# Patient Record
Sex: Female | Born: 1980 | Race: Black or African American | Hispanic: No | Marital: Married
Health system: Midwestern US, Community
[De-identification: ages and names within clinical notes are randomized; demographics above are authoritative.]

---

## 2013-09-20 LAB — EKG, 12 LEAD, INITIAL
Atrial Rate: 76 {beats}/min
Calculated P Axis: 65 degrees
Calculated R Axis: 45 degrees
Calculated T Axis: 27 degrees
Diagnosis: NORMAL
P-R Interval: 170 ms
Q-T Interval: 382 ms
QRS Duration: 76 ms
QTC Calculation (Bezet): 429 ms
Ventricular Rate: 76 {beats}/min

## 2013-09-20 MED ADMIN — sodium chloride 0.9 % bolus infusion 1,000 mL: INTRAVENOUS | @ 12:00:00 | NDC 00409798309

## 2013-09-20 MED ADMIN — meclizine (ANTIVERT) tablet 50 mg: ORAL | @ 13:00:00 | NDC 00536398501

## 2013-09-20 NOTE — ED Notes (Signed)
PT. Medicated as ordered , resting in bed at this time stable will continue to monitor.

## 2013-09-20 NOTE — ED Notes (Signed)
"  I threw up twice too."

## 2013-09-20 NOTE — ED Notes (Signed)
"  I woke up around 3 this morning really dizzy."

## 2013-09-20 NOTE — ED Provider Notes (Signed)
HPI Comments: Nicole Watson is a 32 y.o. Female who was brought to he ED by her spouse for evaluation. Spouse is present. Patient c/o dizziness since waking up at 0300 AM. She describes the dizziness as the "room-spinning" and states that she was barely able to stand secondary to the dizziness. The dizziness is associated with nausea/vomiting  x2 that began at 0630 AM, generalized weakness, blurry vision, and states that the dizziness worsens with movement. Patient denies ever having sx's before. She states that she ran 2 miles yesterday and thought that she was just tired from that. She has no medical problems. Denies tinnitus, headache, and any new medication. No other complaints expressed at this time.     The history is provided by the patient.        History reviewed. No pertinent past medical history.     Past Surgical History   Procedure Laterality Date   ??? Hx cesarean section     ??? Hx other surgical       tummy tuck         History reviewed. No pertinent family history.     History     Social History   ??? Marital Status: MARRIED     Spouse Name: N/A     Number of Children: N/A   ??? Years of Education: N/A     Occupational History   ??? Not on file.     Social History Main Topics   ??? Smoking status: Never Smoker    ??? Smokeless tobacco: Not on file   ??? Alcohol Use: No   ??? Drug Use: Not on file   ??? Sexually Active: Not on file     Other Topics Concern   ??? Not on file     Social History Narrative   ??? No narrative on file                  ALLERGIES: Pcn      Review of Systems   Constitutional: Negative for fever and chills.   HENT: Negative for congestion, sore throat and rhinorrhea.    Eyes: Negative.    Respiratory: Negative for cough and shortness of breath.    Cardiovascular: Negative for chest pain.   Gastrointestinal: Positive for nausea and vomiting. Negative for abdominal pain and diarrhea.   Endocrine: Negative.    Genitourinary: Negative.    Musculoskeletal: Negative for back pain.   Skin: Negative for  rash.   Allergic/Immunologic: Negative.    Neurological: Positive for dizziness (room spinning) and weakness (generalized). Negative for syncope, light-headedness and headaches.   Hematological: Negative.    Psychiatric/Behavioral: Negative.    All other systems reviewed and are negative.        Filed Vitals:    09/20/13 0804 09/20/13 0815 09/20/13 0830 09/20/13 0845   BP: 140/96 152/88  128/87   Pulse: 72 71 65 74   Temp: 98.5 ??F (36.9 ??C)      Resp: 16 15 16 19    Height: 5\' 3"  (1.6 m)      Weight: 62.143 kg (137 lb)      SpO2: 100% 100% 100% 100%            Physical Exam   Nursing note and vitals reviewed.  Constitutional: She is oriented to person, place, and time. She appears well-developed and well-nourished. No distress.   HENT:   Head: Normocephalic and atraumatic.   Mouth/Throat: Oropharynx is clear and moist.   Eyes:  Conjunctivae are normal. Pupils are equal, round, and reactive to light. No scleral icterus.   Neck: Normal range of motion. Neck supple. No tracheal deviation present.   Cardiovascular: Normal rate, normal heart sounds and intact distal pulses.    Pulmonary/Chest: Effort normal and breath sounds normal. No respiratory distress. She has no wheezes.   Abdominal: Soft. Bowel sounds are normal. She exhibits no distension. There is no tenderness.   Musculoskeletal: Normal range of motion. She exhibits no edema.   Lymphadenopathy:     She has no cervical adenopathy.   Neurological: She is alert and oriented to person, place, and time. She has normal strength. No cranial nerve deficit.   She has worsening dizziness with movement. No cerebellar ataxia.    Skin: Skin is warm and dry. She is not diaphoretic.   Psychiatric: She has a normal mood and affect.        MDM     Differential Diagnosis; Clinical Impression; Plan:     Vertigo vs viral illness     AFVSS  gave IVF meclizine    Feeling better. Dc home with fu. Rx meclizine        Amount and/or Complexity of Data Reviewed:   Tests in the radiology  section of CPT??:  Ordered and reviewed  Discussion of test results with the performing providers:  No   Decide to obtain previous medical records or to obtain history from someone other than the patient:  No   Obtain history from someone other than the patient:  No   Review and summarize past medical records:  Yes   Discuss the patient with another provider:  No   Independant visualization of image, tracing, or specimen:  Yes      Procedures    PROGRESS NOTES  8:16 AM: Ewing Schlein, DO arrives to the bedside to evaluate the patient. Spouse is present. Answered the patient's questions regarding the treatment plan.  9:32 AM: The patient feels better. Reviewed EKG results with the patient. Discharge instructions and plan for follow-up discussed with the patient. The patient is ready to go home.       CONSULTATIONS  None      ED PHYSICIAN ORDERS  Orders Placed This Encounter   ??? HCG URINE, QL     Standing Status: Standing      Number of Occurrences: 1      Standing Expiration Date:    ??? URINALYSIS W/ RFLX MICROSCOPIC     Standing Status: Standing      Number of Occurrences: 1      Standing Expiration Date:    ??? EKG, 12 LEAD, INITIAL     Standing Status: Standing      Number of Occurrences: 1      Standing Expiration Date:      Order Specific Question:  Reason for Exam:     Answer:  dizziness   ??? multivitamin (ONE A DAY) tablet     Sig: Take 1 tablet by mouth daily.   ??? ibuprofen (MOTRIN) 800 mg tablet     Sig: Take 800 mg by mouth every six (6) hours as needed for Pain.   ??? sodium chloride 0.9 % bolus infusion 1,000 mL     Sig:    ??? meclizine (ANTIVERT) tablet 50 mg     Sig:            MEDICATIONS ORDERED  Medications   sodium chloride 0.9 % bolus infusion 1,000 mL (1,000 mL  IntraVENous New Bag 09/20/13 0829)   meclizine (ANTIVERT) tablet 50 mg (50 mg Oral Given 09/20/13 0830)           RADIOLOGY INTERPRETATIONS  None        EKG READINGS/LABORATORY RESULTS  Recent Results (from the past 12 hour(s))   EKG, 12 LEAD,  INITIAL    Collection Time     09/20/13  8:18 AM       Result Value Range    Ventricular Rate 76      Atrial Rate 76      P-R Interval 170      QRS Duration 76      Q-T Interval 382      QTC Calculation (Bezet) 429      Calculated P Axis 65      Calculated R Axis 45      Calculated T Axis 27      Diagnosis        Value: Normal sinus rhythm      Left atrial enlargement      Nonspecific ST and T wave abnormality      Confirmed by Martie Round ([id number]) on 09/20/2013 8:48:38 AM     8:25 AM: Rhythm: normal sinus rhythm. Rate (approx.):76; Axis: normal;  QRS interval: normal ; ST/T wave: No ST elevation or T-wave conversion. This EKG was interpreted by Ewing Schlein, DO,ED Provider.        ED DIAGNOSIS & DISPOSITION INFORMATION  Diagnosis: No diagnosis found.      Disposition: Discharge    Follow-up Information    None          Patient's Medications   Start Taking    No medications on file   Continue Taking    IBUPROFEN (MOTRIN) 800 MG TABLET    Take 800 mg by mouth every six (6) hours as needed for Pain.    MULTIVITAMIN (ONE A DAY) TABLET    Take 1 tablet by mouth daily.   These Medications have changed    No medications on file   Stop Taking    No medications on file         SCRIBE ATTESTATION STATEMENT  Documented by: Zenita N. Wharf, scribing for and in the presence of Ewing Schlein, DO. (8:16 AM)    PROVIDER ATTESTATION STATEMENT  I personally performed the services described in the documentation, reviewed the documentation, as recorded by the scribe in my presence, and it accurately and completely records my words and actions.  Ewing Schlein, DO. 9:32 AM

## 2013-09-20 NOTE — ED Notes (Signed)
PT. Resting in bed at this time asleep, stable will continue to monitor.

## 2013-09-20 NOTE — ED Notes (Signed)
Assumed pt. Care - pt. C/o dizziness since 3 am this morning - pt. Stated she has also vomited x2 .     PT. Presents alert and oriented x4 c/o dizziness at this time, no active vomiting noted.    PT. Placed on cardiac monitor, IV established blood drawn and sent to lab, EKG completed and shown to MD.     PT. Resting in bed at this time on cardiac monitor, VSS, pt. Stable, family at bedside will continue to monitor.

## 2013-09-20 NOTE — ED Notes (Signed)
PT. Stated her "tubes are tied", denies being pregnant.

## 2013-09-20 NOTE — ED Notes (Signed)
Discharge instructions reviewed with pt- pt verbalized understanding - pt stable ambulatory with family upon discharge.

## 2016-07-07 ENCOUNTER — Inpatient Hospital Stay
Admit: 2016-07-07 | Discharge: 2016-07-07 | Disposition: A | Discharge status: Home / Self Care | Payer: TRICARE (CHAMPUS) | Attending: Emergency Medicine

## 2016-07-07 DIAGNOSIS — R51 Headache: Secondary | ICD-10-CM

## 2016-07-07 LAB — CBC WITH AUTOMATED DIFF
ABS. BASOPHILS: 0 10*3/uL (ref 0.0–0.06)
ABS. EOSINOPHILS: 0.2 10*3/uL (ref 0.0–0.4)
ABS. LYMPHOCYTES: 1.3 10*3/uL (ref 0.9–3.6)
ABS. MONOCYTES: 0.8 10*3/uL (ref 0.05–1.2)
ABS. NEUTROPHILS: 4 10*3/uL (ref 1.8–8.0)
BASOPHILS: 0 % (ref 0–2)
EOSINOPHILS: 3 % (ref 0–5)
HCT: 37.3 % (ref 35.0–45.0)
HGB: 12.1 g/dL (ref 12.0–16.0)
LYMPHOCYTES: 21 % (ref 21–52)
MCH: 26.8 PG (ref 24.0–34.0)
MCHC: 32.4 g/dL (ref 31.0–37.0)
MCV: 82.7 FL (ref 74.0–97.0)
MONOCYTES: 12 % — ABNORMAL HIGH (ref 3–10)
MPV: 10.5 FL (ref 9.2–11.8)
NEUTROPHILS: 64 % (ref 40–73)
PLATELET: 265 10*3/uL (ref 135–420)
RBC: 4.51 M/uL (ref 4.20–5.30)
RDW: 13.4 % (ref 11.6–14.5)
WBC: 6.2 10*3/uL (ref 4.6–13.2)

## 2016-07-07 LAB — METABOLIC PANEL, BASIC
Anion gap: 9 mmol/L (ref 3.0–18)
BUN/Creatinine ratio: 15 (ref 12–20)
BUN: 15 MG/DL (ref 7.0–18)
CO2: 27 mmol/L (ref 21–32)
Calcium: 9.4 MG/DL (ref 8.5–10.1)
Chloride: 104 mmol/L (ref 100–108)
Creatinine: 0.98 MG/DL (ref 0.6–1.3)
GFR est AA: >60 mL/min/{1.73_m2} (ref >60)
GFR est non-AA: >60 mL/min/{1.73_m2} (ref >60)
Glucose: 79 mg/dL (ref 74–99)
Potassium: 3.8 mmol/L (ref 3.5–5.5)
Sodium: 140 mmol/L (ref 136–145)

## 2016-07-07 MED ORDER — LISINOPRIL 10 MG TAB
10 mg | ORAL_TABLET | Freq: Every day | ORAL | 0 refills | Status: AC
Start: 2016-07-07 — End: 2016-08-06

## 2016-07-07 MED ORDER — IBUPROFEN 400 MG TAB
400 mg | ORAL | Status: AC
Start: 2016-07-07 — End: 2016-07-07
  Administered 2016-07-07: 20:00:00 via ORAL

## 2016-07-07 MED ORDER — IBUPROFEN 800 MG TAB
800 mg | ORAL_TABLET | Freq: Three times a day (TID) | ORAL | 0 refills | Status: AC
Start: 2016-07-07 — End: 2016-07-12

## 2016-07-07 MED ORDER — CLONIDINE 0.1 MG TAB
0.1 mg | ORAL | Status: AC
Start: 2016-07-07 — End: 2016-07-07
  Administered 2016-07-07: 20:00:00 via ORAL

## 2016-07-07 MED FILL — CLONIDINE 0.1 MG TAB: 0.1 mg | ORAL | Qty: 2

## 2016-07-07 MED FILL — IBUPROFEN 400 MG TAB: 400 mg | ORAL | Qty: 2

## 2016-07-07 NOTE — ED Notes (Signed)
I have reviewed discharge instructions with the patient.  The patient verbalized understanding.

## 2016-07-07 NOTE — ED Provider Notes (Signed)
[place]  [facility]      35 y.o. female with noted past medical history who presents to the emergency department complaining of a "sharp" headache starting approximately 2 hours ago while at work.  She denies taking medication for the ailment.  Denies visual disturbances.     No other complaints.     No current facility-administered medications for this encounter.      Current Outpatient Prescriptions   Medication Sig   ??? multivitamin (ONE A DAY) tablet Take 1 tablet by mouth daily.       No past medical history on file.    Past Surgical History:   Procedure Laterality Date   ??? HX CESAREAN SECTION     ??? HX OTHER SURGICAL      tummy tuck       No family history on file.    Social History     Social History   ??? Marital status: MARRIED     Spouse name: N/A   ??? Number of children: N/A   ??? Years of education: N/A     Occupational History   ??? Not on file.     Social History Main Topics   ??? Smoking status: Never Smoker   ??? Smokeless tobacco: Never Used   ??? Alcohol use No   ??? Drug use: Not on file   ??? Sexual activity: Not on file     Other Topics Concern   ??? Not on file     Social History Narrative       Allergies   Allergen Reactions   ??? Pcn [Penicillins] Rash       Patient's primary care provider (as noted in EPIC):  Phys Other, MD    REVIEW OF SYSTEMS:    Constitutional:  Negative for diaphoresis.  HENT:  Negative for congestion.    Respiratory:  Negative for cough and shortness of breath.    Cardiovascular:  Negative for chest pain and palpitations.   Gastrointestinal:  Negative for diarrhea.  Genitourinary:  Negative for flank pain.   Musculoskeletal:  Negative for back pain.   Skin:  Negative for pallor.   Neurological:  Negative for focal numbness, weakness or tingling.  Negative for slurred speech.    Visit Vitals   ??? BP (!) 173/120   ??? Pulse 80   ??? Temp 98.5 ??F (36.9 ??C)   ??? Resp 16   ??? Wt 67.6 kg (149 lb)   ??? SpO2 100%   ??? BMI 26.39 kg/m2       PHYSICAL EXAM:     CONSTITUTIONAL:  Alert, in no apparent distress;  well developed;  well nourished.  HEAD:  Normocephalic, atraumatic.  EYES:  EOMI.  Non-icteric sclera.  Normal conjunctiva.  ENTM:  Nose:  no rhinorrhea.  Throat:  no erythema or exudate, mucous membranes moist.  NECK:  No JVD.  Supple  RESPIRATORY:  Chest clear, equal breath sounds, good air movement.  CARDIOVASCULAR:  Regular rate and rhythm.  No murmurs, rubs, or gallops.  GI:  Normal bowel sounds, abdomen soft and non-tender.  No rebound or guarding.  BACK:  Non-tender.  UPPER EXT:  Normal inspection.  LOWER EXT:  No edema, no calf tenderness.  Distal pulses intact.  NEURO:  Moves all four extremities.  Normal motor exam and sensation in all four extremities.  Normal CN II-XII exam.  Normal bilateral finger-to-nose exam.      SKIN:  No rashes;  Normal for age.  PSYCH:  Alert and normal affect.    DIFFERENTIAL DIAGNOSES/ MEDICAL DECISION MAKING:  Tension headache, cluster headache, migraine headache, hypertension induced headache, temporal arteritis, intracranial hemorrhage, meningitis, electrolyte and/or endocrine imbalance, other etiologies, versus combination of the above.    No focal neuro deficits to suggest intracranial hemorrhage. No fever, neck stiffness, nor meningeal signs to suggest meningitis.    Abnormal lab results from this emergency department encounter:  Labs Reviewed   CBC WITH AUTOMATED DIFF - Abnormal; Notable for the following:        Result Value    MONOCYTES 12 (*)     All other components within normal limits   METABOLIC PANEL, BASIC       Lab values for this patient within approximately the last 12 hours:  Recent Results (from the past 12 hour(s))   CBC WITH AUTOMATED DIFF    Collection Time: 07/07/16  3:50 PM   Result Value Ref Range    WBC 6.2 4.6 - 13.2 K/uL    RBC 4.51 4.20 - 5.30 M/uL    HGB 12.1 12.0 - 16.0 g/dL    HCT 09.8 11.9 - 14.7 %    MCV 82.7 74.0 - 97.0 FL    MCH 26.8 24.0 - 34.0 PG    MCHC 32.4 31.0 - 37.0 g/dL     RDW 82.9 56.2 - 13.0 %    PLATELET 265 135 - 420 K/uL    MPV 10.5 9.2 - 11.8 FL    NEUTROPHILS 64 40 - 73 %    LYMPHOCYTES 21 21 - 52 %    MONOCYTES 12 (H) 3 - 10 %    EOSINOPHILS 3 0 - 5 %    BASOPHILS 0 0 - 2 %    ABS. NEUTROPHILS 4.0 1.8 - 8.0 K/UL    ABS. LYMPHOCYTES 1.3 0.9 - 3.6 K/UL    ABS. MONOCYTES 0.8 0.05 - 1.2 K/UL    ABS. EOSINOPHILS 0.2 0.0 - 0.4 K/UL    ABS. BASOPHILS 0.0 0.0 - 0.06 K/UL    DF AUTOMATED     METABOLIC PANEL, BASIC    Collection Time: 07/07/16  3:50 PM   Result Value Ref Range    Sodium 140 136 - 145 mmol/L    Potassium 3.8 3.5 - 5.5 mmol/L    Chloride 104 100 - 108 mmol/L    CO2 27 21 - 32 mmol/L    Anion gap 9 3.0 - 18 mmol/L    Glucose 79 74 - 99 mg/dL    BUN 15 7.0 - 18 MG/DL    Creatinine 8.65 0.6 - 1.3 MG/DL    BUN/Creatinine ratio 15 12 - 20      GFR est AA >60 >60 ml/min/1.4m2    GFR est non-AA >60 >60 ml/min/1.48m2    Calcium 9.4 8.5 - 10.1 MG/DL       Radiologist and cardiologist interpretations if available at time of this note:  No results found.    Medication(s) ordered for patient during this emergency visit encounter:  Medications   cloNIDine HCl (CATAPRES) tablet 0.2 mg (0.2 mg Oral Given 07/07/16 1535)   ibuprofen (MOTRIN) tablet 800 mg (800 mg Oral Given 07/07/16 1535)       ED COURSE:    The patient's headache was markedly improved with the noted non-narcotic medications.      IMPRESSION AND MEDICAL DECISION MAKING:  Based upon the patient's presentation with noted HPI and PE, along with the work up done in  the emergency department, I believe that the patient is having a headache.      The patient does not appear toxic and is neurologically normal on exam.  No evidence for SAH, meningitis, tumor, or other malignant cause today based on evaluation today.  I do not believe that this is a CVA or TIA presentation.  Given the history and physical exam, I believe symptomatic treatment is appropriate at this time.  The patient understands the need  for follow-up and the symptoms and signs that would mandate immediate return to the ED for re-evaluation.    DIAGNOSIS:  1.  Headache.   2.  Elevated blood pressure without history of hypertension.     SPECIFIC PATIENT INSTRUCTIONS FROM THE PHYSICIAN WHO TREATED YOU IN THE ER TODAY:  1.  Return if any concerns or worsening of condition(s)  2.  Ibuprofen as prescribed only if you have a headache.   3.  FOLLOW UP APPOINTMENT:  Your primary doctor in the next week.   4.  Have you blood pressure recheck by your doctor this week. It was elevated during your Emergency Department visit today. In the days before you see your doctor, recheck your blood pressure at home 2 times a day at the same times.  For example, you may decide to record your blood pressure at 8 am and 9 pm every day. Or 7 am and 7 pm every day.  Then take this list of recorded blood pressure readings to your doctor when you follow up with them. This will help guide them about what needs to be done with your blood pressure, if anything.   5.  Lisinopril as prescribed.     Arlys John L. Westley Foots, M.D.    Provider Attestation:  If a scribe was utilized in generation of this patient record, I personally performed the services described in the documentation, reviewed the documentation, as recorded by the scribe in my presence, and it accurately records the patient's history of presenting illness, review of systems, patient physical examination, and procedures performed by me as the attending physician.     Arlys John L. Westley Foots, M.D.  [facility] Board Certified Emergency Physician  07/07/2016.  3:14 PM    Scribe Attestation  Echo Lindenhurst acting as a scribe for and in the presence of Suzanna Obey, MD  July 07, 2016 at 3:57 PM       Provider Attestation:  I personally performed the services described in the documentation, reviewed the documentation, as recorded by the scribe in my presence, and it accurately and completely records my words and actions.   Suzanna Obey, MD      Signed by: Trudee Grip, Scribe, July 07, 2016 at 3:57 PM

## 2016-07-07 NOTE — ED Triage Notes (Signed)
1 hour ago left temporal headache started. Unable to look at computer screen due to headache. [employer]

## 2017-02-24 ENCOUNTER — Inpatient Hospital Stay
Admit: 2017-02-24 | Discharge: 2017-02-24 | Disposition: A | Discharge status: Home / Self Care | Payer: TRICARE (CHAMPUS) | Attending: Emergency Medicine

## 2017-02-24 ENCOUNTER — Emergency Department: Admit: 2017-02-24 | Payer: TRICARE (CHAMPUS) | Primary: Family Medicine

## 2017-02-24 DIAGNOSIS — R51 Headache: Secondary | ICD-10-CM

## 2017-02-24 LAB — CBC WITH AUTOMATED DIFF
ABS. BASOPHILS: 0 10*3/uL (ref 0.0–0.06)
ABS. EOSINOPHILS: 0.2 10*3/uL (ref 0.0–0.4)
ABS. LYMPHOCYTES: 1 10*3/uL (ref 0.9–3.6)
ABS. MONOCYTES: 0.5 10*3/uL (ref 0.05–1.2)
ABS. NEUTROPHILS: 3.4 10*3/uL (ref 1.8–8.0)
BASOPHILS: 0 % (ref 0–2)
EOSINOPHILS: 3 % (ref 0–5)
HCT: 36.4 % (ref 35.0–45.0)
HGB: 11.8 g/dL — ABNORMAL LOW (ref 12.0–16.0)
LYMPHOCYTES: 20 % — ABNORMAL LOW (ref 21–52)
MCH: 26.9 PG (ref 24.0–34.0)
MCHC: 32.4 g/dL (ref 31.0–37.0)
MCV: 83.1 FL (ref 74.0–97.0)
MONOCYTES: 9 % (ref 3–10)
MPV: 10.2 FL (ref 9.2–11.8)
NEUTROPHILS: 68 % (ref 40–73)
PLATELET: 242 10*3/uL (ref 135–420)
RBC: 4.38 M/uL (ref 4.20–5.30)
RDW: 13.8 % (ref 11.6–14.5)
WBC: 5 10*3/uL (ref 4.6–13.2)

## 2017-02-24 LAB — METABOLIC PANEL, BASIC
Anion gap: 8 mmol/L (ref 3.0–18)
BUN/Creatinine ratio: 14 (ref 12–20)
BUN: 13 MG/DL (ref 7.0–18)
CO2: 26 mmol/L (ref 21–32)
Calcium: 9.1 MG/DL (ref 8.5–10.1)
Chloride: 107 mmol/L (ref 100–108)
Creatinine: 0.91 MG/DL (ref 0.6–1.3)
GFR est AA: >60 mL/min/{1.73_m2} (ref >60)
GFR est non-AA: >60 mL/min/{1.73_m2} (ref >60)
Glucose: 80 mg/dL (ref 74–99)
Potassium: 3.8 mmol/L (ref 3.5–5.5)
Sodium: 141 mmol/L (ref 136–145)

## 2017-02-24 MED ORDER — CLONIDINE 0.1 MG TAB
0.1 mg | ORAL | Status: AC
Start: 2017-02-24 — End: 2017-02-24
  Administered 2017-02-24: 21:00:00 via ORAL

## 2017-02-24 MED ORDER — METOCLOPRAMIDE 5 MG/ML IJ SOLN
5 mg/mL | INTRAMUSCULAR | Status: AC
Start: 2017-02-24 — End: 2017-02-24
  Administered 2017-02-24: 21:00:00 via INTRAVENOUS

## 2017-02-24 MED ORDER — DIPHENHYDRAMINE HCL 50 MG/ML IJ SOLN
50 mg/mL | INTRAMUSCULAR | Status: AC
Start: 2017-02-24 — End: 2017-02-24
  Administered 2017-02-24: 21:00:00 via INTRAVENOUS

## 2017-02-24 MED FILL — DIPHENHYDRAMINE HCL 50 MG/ML IJ SOLN: 50 mg/mL | INTRAMUSCULAR | Qty: 1

## 2017-02-24 MED FILL — CLONIDINE 0.1 MG TAB: 0.1 mg | ORAL | Qty: 1

## 2017-02-24 MED FILL — METOCLOPRAMIDE 5 MG/ML IJ SOLN: 5 mg/mL | INTRAMUSCULAR | Qty: 2

## 2017-02-24 NOTE — ED Triage Notes (Signed)
Patient states she has "sharp" pain to the L side of her head. States the pain started several months ago, no vision changes. deneis nausea

## 2017-02-24 NOTE — ED Notes (Signed)
I have reviewed discharge instructions with the patient.  The patient verbalized understanding.

## 2017-02-24 NOTE — ED Provider Notes (Signed)
EMERGENCY DEPARTMENT HISTORY AND PHYSICAL EXAM    3:44 PM      Date: 02/24/2017  Patient Name: Nicole IsaacsLatonya D Russaw    History of Presenting Illness     Chief Complaint   Patient presents with   ??? Head Pain         History Provided By: Patient    Chief Complaint: Head pain  Duration:  Months  Timing:  Intermittent  Location: Left side of head  Quality: Sharp  Severity: 3/10  Modifying Factors: Some relief with HTN medication and 800MG  ibuprofen  Associated Symptoms: Denies any      Additional History (Context): 3:52 PM Nicole IsaacsLatonya D Grosz is a 36 y.o. female with h/o HTN who presents to ED complaining of sharp intermittent left sided head pain that lasts for seconds at a time that is a 3/10 in severity now onset months but comes and goes. Patient states that the pain only last for seconds and is intermittent but today she experienced more episodes of pain. States it feels like an "ice pick". Earlier her pain was sharp and severe. Denies visual disturbances. No other concerns or symptoms at this time.    PCP: Phys Other, MD    Current Outpatient Prescriptions   Medication Sig Dispense Refill   ??? lisinopril (PRINIVIL, ZESTRIL) 20 mg tablet Take 20 mg by mouth daily.     ??? ergocalciferol (VITAMIN D2) 50,000 unit capsule Take 50,000 Units by mouth.     ??? multivitamin (ONE A DAY) tablet Take 1 tablet by mouth daily.         Past History     Past Medical History:  Past Medical History:   Diagnosis Date   ??? Hypertension        Past Surgical History:  Past Surgical History:   Procedure Laterality Date   ??? HX CESAREAN SECTION     ??? HX OTHER SURGICAL      tummy tuck       Family History:  History reviewed. No pertinent family history.    Social History:  Social History   Substance Use Topics   ??? Smoking status: Never Smoker   ??? Smokeless tobacco: Never Used   ??? Alcohol use No       Allergies:  Allergies   Allergen Reactions   ??? Pcn [Penicillins] Rash         Review of Systems     Review of Systems    Constitutional: Negative for diaphoresis and fever.   HENT: Negative for congestion and sore throat.    Eyes: Negative for pain, itching and visual disturbance.   Respiratory: Negative for cough and shortness of breath.    Cardiovascular: Negative for chest pain and palpitations.   Gastrointestinal: Negative for abdominal pain and diarrhea.   Endocrine: Negative for polydipsia and polyuria.   Genitourinary: Negative for dysuria and hematuria.   Musculoskeletal: Negative for arthralgias and myalgias.   Skin: Negative for rash and wound.   Neurological: Positive for headaches. Negative for seizures and syncope.   Hematological: Does not bruise/bleed easily.   Psychiatric/Behavioral: Negative for agitation and hallucinations.         Physical Exam     Visit Vitals   ??? BP 123/74   ??? Pulse 75   ??? Temp 98.1 ??F (36.7 ??C)   ??? Resp 16   ??? Ht 5\' 3"  (1.6 m)   ??? Wt 66.7 kg (147 lb)   ??? LMP 02/15/2017   ??? SpO2  99%   ??? BMI 26.04 kg/m2       Physical Exam   Constitutional: She is oriented to person, place, and time. She appears well-developed and well-nourished.   HENT:   Head: Normocephalic and atraumatic.   No tenderness of the head.  No TMJ tenderness   Eyes: Conjunctivae are normal. No scleral icterus.   Neck: Normal range of motion. Neck supple. No JVD present.   Cardiovascular: Normal rate, regular rhythm and normal heart sounds.    4 intact extremity pulses   Pulmonary/Chest: Effort normal and breath sounds normal.   Abdominal: Soft. She exhibits no mass. There is no tenderness.   Musculoskeletal: Normal range of motion.   Lymphadenopathy:     She has no cervical adenopathy.   Neurological: She is alert and oriented to person, place, and time. She has normal strength. No cranial nerve deficit or sensory deficit. Coordination normal.   Skin: Skin is warm and dry.   Nursing note and vitals reviewed.        Diagnostic Study Results     Labs -  Recent Results (from the past 12 hour(s))   CBC WITH AUTOMATED DIFF     Collection Time: 02/24/17  2:55 PM   Result Value Ref Range    WBC 5.0 4.6 - 13.2 K/uL    RBC 4.38 4.20 - 5.30 M/uL    HGB 11.8 (L) 12.0 - 16.0 g/dL    HCT 91.4 78.2 - 95.6 %    MCV 83.1 74.0 - 97.0 FL    MCH 26.9 24.0 - 34.0 PG    MCHC 32.4 31.0 - 37.0 g/dL    RDW 21.3 08.6 - 57.8 %    PLATELET 242 135 - 420 K/uL    MPV 10.2 9.2 - 11.8 FL    NEUTROPHILS 68 40 - 73 %    LYMPHOCYTES 20 (L) 21 - 52 %    MONOCYTES 9 3 - 10 %    EOSINOPHILS 3 0 - 5 %    BASOPHILS 0 0 - 2 %    ABS. NEUTROPHILS 3.4 1.8 - 8.0 K/UL    ABS. LYMPHOCYTES 1.0 0.9 - 3.6 K/UL    ABS. MONOCYTES 0.5 0.05 - 1.2 K/UL    ABS. EOSINOPHILS 0.2 0.0 - 0.4 K/UL    ABS. BASOPHILS 0.0 0.0 - 0.06 K/UL    DF AUTOMATED     METABOLIC PANEL, BASIC    Collection Time: 02/24/17  2:55 PM   Result Value Ref Range    Sodium 141 136 - 145 mmol/L    Potassium 3.8 3.5 - 5.5 mmol/L    Chloride 107 100 - 108 mmol/L    CO2 26 21 - 32 mmol/L    Anion gap 8 3.0 - 18 mmol/L    Glucose 80 74 - 99 mg/dL    BUN 13 7.0 - 18 MG/DL    Creatinine 4.69 0.6 - 1.3 MG/DL    BUN/Creatinine ratio 14 12 - 20      GFR est AA >60 >60 ml/min/1.71m2    GFR est non-AA >60 >60 ml/min/1.33m2    Calcium 9.1 8.5 - 10.1 MG/DL       Radiologic Studies -   CT HEAD WO CONT   Final Result        Ct Head Wo Cont    Result Date: 02/24/2017  EXAM: CT head without contrast INDICATION: Headache. Severe left-sided headache, worse today than ever before. Intact neurologic exam. COMPARISON: None. TECHNIQUE:  Axial CT imaging of the head  was obtained from skull base to vertex without intravenous contrast. One or more dose reduction techniques were used on this CT: automated exposure control, adjustment of the mAs and/or kVp according to patient's size, and iterative reconstruction techniques. The specific techniques utilized on this CT exam have been documented in the patient's electronic medical record. _______________ FINDINGS: BRAIN AND POSTERIOR FOSSA: The sulci,  folia, ventricles and basal cisterns are within normal limits for the patient?s age. There is no intracranial hemorrhage, mass effect, or shift of midline structures.. There are no areas of abnormal parenchymal attenuation. EXTRA-AXIAL SPACES AND MENINGES: There are no abnormal extra-axial fluid collections. CALVARIUM: No acute osseous abnormality.Marland Kitchen SINUSES: The visualized portions of the paranasal sinuses and mastoid air cells are well aerated.. OTHER: None. _______________     IMPRESSION: 1.  No acute intracranial abnormalities are identified.        Medical Decision Making   Initial Medical Decision Making and DDx:  Not really suggestive of migraine, cluster, tension, Trigeminal neuralgia, Temporal Arteritis, or TMJ syndrome. Will try migraine meds, plan on DC and FU with neurology [facility] [employer].     ED Course: Progress Notes, Reevaluation, and Consults:  6:30 PM Head CT shows nothing acute. Discussed results with patient. She is sleepy and pain is resolved. She is to follow up with [facility] PCP and Neuro. Patient instructed to return for emergencies. Questions answered and she is happy with plan.       I am the first provider for this patient.    I reviewed the vital signs, available nursing notes, past medical history, past surgical history, family history and social history.    Vital Signs-Reviewed the patient's vital signs.    Pulse Oximetry Analysis - 99% in room air    Records Reviewed: Nursing Notes and Old Medical Records (Time of Review: 3:44 PM)    Diagnosis     Clinical Impression:   1. Nonintractable episodic headache, unspecified headache type        Disposition: DC    Follow-up Information     Follow up With Details Comments Contact Info    [facility] primary       [facility] neuro              Patient's Medications   Start Taking    No medications on file   Continue Taking    ERGOCALCIFEROL (VITAMIN D2) 50,000 UNIT CAPSULE    Take 50,000 Units by mouth.     LISINOPRIL (PRINIVIL, ZESTRIL) 20 MG TABLET    Take 20 mg by mouth daily.    MULTIVITAMIN (ONE A DAY) TABLET    Take 1 tablet by mouth daily.   These Medications have changed    No medications on file   Stop Taking    No medications on file     _______________________________    Attestations:  Scribe Attestation     Marzetta Merino acting as a Neurosurgeon for and in the presence of Deborra Medina, MD      February 24, 2017 at 3:44 PM       Provider Attestation:      I personally performed the services described in the documentation, reviewed the documentation, as recorded by the scribe in my presence, and it accurately and completely records my words and actions. February 24, 2017 at 3:44 PM - Deborra Medina, MD    _______________________________

## 2017-02-24 NOTE — ED Notes (Cosign Needed)
I performed a brief evaluation, including history and physical, of the patient here in triage and I have determined that pt will need further treatment and evaluation from the main side ER physician.  I have placed initial orders to help in expediting patients care. Left, unilateral, sharp HA, "For months almost daily", but much worse today. Takes lisinopril 20mg  daily, usually brings BP down, but today has not. Has also tried 800mg  motrin at 1120 today.   February 24, 2017 at 2:13 PM - Mechele CollinAshley N. Stasha Naraine, PA-C        Visit Vitals   ??? BP (!) 154/111 (BP 1 Location: Right arm, BP Patient Position: At rest;Sitting)   ??? Pulse 81   ??? Temp 98.1 ??F (36.7 ??C)   ??? Resp 16   ??? Ht 5\' 3"  (1.6 m)   ??? Wt 66.7 kg (147 lb)   ??? SpO2 100%   ??? BMI 26.04 kg/m2

## 2018-09-14 ENCOUNTER — Inpatient Hospital Stay
Admit: 2018-09-14 | Discharge: 2018-09-14 | Disposition: A | Discharge status: Home / Self Care | Payer: TRICARE (CHAMPUS) | Attending: Emergency Medicine

## 2018-09-14 ENCOUNTER — Emergency Department: Admit: 2018-09-14 | Payer: TRICARE (CHAMPUS) | Primary: Family Medicine

## 2018-09-14 DIAGNOSIS — R42 Dizziness and giddiness: Secondary | ICD-10-CM

## 2018-09-14 MED ORDER — MECLIZINE 25 MG TAB
25 mg | ORAL_TABLET | ORAL | 0 refills | Status: DC
Start: 2018-09-14 — End: 2019-05-27

## 2018-09-14 MED ORDER — DIAZEPAM 5 MG TAB
5 mg | ORAL | Status: DC
Start: 2018-09-14 — End: 2018-09-14
  Administered 2018-09-14: 15:00:00 via ORAL

## 2018-09-14 MED ORDER — ONDANSETRON 4 MG TAB, RAPID DISSOLVE
4 mg | ORAL_TABLET | ORAL | 0 refills | Status: DC
Start: 2018-09-14 — End: 2019-05-27

## 2018-09-14 MED ORDER — ONDANSETRON (PF) 4 MG/2 ML INJECTION
4 mg/2 mL | INTRAMUSCULAR | Status: DC
Start: 2018-09-14 — End: 2018-09-14
  Administered 2018-09-14: 15:00:00 via INTRAVENOUS

## 2018-09-14 MED ORDER — ONDANSETRON 4 MG TAB, RAPID DISSOLVE
4 mg | ORAL | Status: AC
Start: 2018-09-14 — End: 2018-09-14
  Administered 2018-09-14: 15:00:00 via ORAL

## 2018-09-14 MED ORDER — MECLIZINE 12.5 MG TAB
12.5 mg | ORAL | Status: AC
Start: 2018-09-14 — End: 2018-09-14
  Administered 2018-09-14: 16:00:00 via ORAL

## 2018-09-14 MED FILL — MECLIZINE 12.5 MG TAB: 12.5 mg | ORAL | Qty: 2

## 2018-09-14 MED FILL — ONDANSETRON 4 MG TAB, RAPID DISSOLVE: 4 mg | ORAL | Qty: 1

## 2018-09-14 NOTE — ED Provider Notes (Signed)
HPI     37 year old female presents with acute onset spinning dizziness upon waking up after sleeping on her left side overnight similar to prior episode of vertigo in 2014.  She reports gait imbalance while walking to and from the bathroom this morning.  Patient tried to take meclizine for her symptoms which was an old prescription from her last episode but vomited the medication up on 2 different occasions.  She did report a mild diffuse headache with history of frequent similar headaches in the past which was relieved by taking BC powder which she did not vomit afterwards.  Her symptoms are greatly improved at this time.    Past Medical History:   Diagnosis Date   ??? Hypertension        Past Surgical History:   Procedure Laterality Date   ??? HX CESAREAN SECTION     ??? HX OTHER SURGICAL      tummy tuck         History reviewed. No pertinent family history.    Social History     Socioeconomic History   ??? Marital status: MARRIED     Spouse name: Not on file   ??? Number of children: Not on file   ??? Years of education: Not on file   ??? Highest education level: Not on file   Occupational History   ??? Not on file   Social Needs   ??? Financial resource strain: Not on file   ??? Food insecurity:     Worry: Not on file     Inability: Not on file   ??? Transportation needs:     Medical: Not on file     Non-medical: Not on file   Tobacco Use   ??? Smoking status: Never Smoker   ??? Smokeless tobacco: Never Used   Substance and Sexual Activity   ??? Alcohol use: No   ??? Drug use: No   ??? Sexual activity: Not on file   Lifestyle   ??? Physical activity:     Days per week: Not on file     Minutes per session: Not on file   ??? Stress: Not on file   Relationships   ??? Social connections:     Talks on phone: Not on file     Gets together: Not on file     Attends religious service: Not on file     Active member of club or organization: Not on file     Attends meetings of clubs or organizations: Not on file     Relationship status: Not on file   ???  Intimate partner violence:     Fear of current or ex partner: Not on file     Emotionally abused: Not on file     Physically abused: Not on file     Forced sexual activity: Not on file   Other Topics Concern   ??? Not on file   Social History Narrative   ??? Not on file         ALLERGIES: Pcn [penicillins]    Review of Systems   Constitutional: Negative for chills and fever.   HENT: Negative for ear pain and sore throat.    Eyes: Negative for pain and visual disturbance.   Respiratory: Negative for cough and shortness of breath.    Cardiovascular: Negative for chest pain and palpitations.   Gastrointestinal: Positive for nausea and vomiting. Negative for abdominal pain and diarrhea.   Genitourinary: Negative for flank pain.  Musculoskeletal: Positive for gait problem. Negative for back pain and neck pain.   Neurological: Positive for dizziness and headaches. Negative for syncope.   Psychiatric/Behavioral: Negative for agitation. The patient is not nervous/anxious.        Vitals:    09/14/18 0943 09/14/18 1133   BP: (!) 168/109 135/88   Pulse: 79 72   Resp: 16 16   Temp: 98.8 ??F (37.1 ??C)    SpO2: 100% 100%   Weight: 68 kg (150 lb)    Height: 5\' 3"  (1.6 m)             Physical Exam   Constitutional: She is oriented to person, place, and time. She appears well-developed and well-nourished. No distress.   HENT:   Head: Normocephalic and atraumatic.   Nose: Nose normal.   Mouth/Throat: Oropharynx is clear and moist.   Eyes: Pupils are equal, round, and reactive to light. Conjunctivae and EOM are normal. No scleral icterus.   Neck: Normal range of motion. Neck supple. No tracheal deviation present.   Cardiovascular: Normal rate, regular rhythm and intact distal pulses.   No murmur heard.  Pulmonary/Chest: Effort normal and breath sounds normal. No respiratory distress.   Abdominal: Soft. There is no tenderness.   Musculoskeletal: Normal range of motion. She exhibits no deformity.   Neurological: She is alert and oriented to  person, place, and time. No cranial nerve deficit or sensory deficit. She exhibits normal muscle tone. Coordination normal.   No nystagmus.  Dix-Hallpike testing negative.  Drink 5 out of 5 in all extremities.  Normal gait and negative Romberg testing   Skin: Skin is warm and dry. Capillary refill takes less than 2 seconds. No rash noted. She is not diaphoretic.   Psychiatric: She has a normal mood and affect.   Nursing note and vitals reviewed.       MDM     Acute onset dizziness this morning upon waking associated with nausea vomiting and mild headache which has since resolved.  Patient symptoms similar to prior episode of vertigo in 2014.  On my evaluation she has no neurological deficits, symptoms are improved and Dix-Hallpike testing is negative.  Will give 1 dose of oral meclizine and renew her prescriptions in case her symptoms return upon going home.  Patient CT head was negative for acute intracranial abnormality, I am most suspicious for BPPV.    Patient has no new complaints, changes, or physical findings.   Diagnostic studies if preformed were reviewed with the patient and/or family.  All questions and concerns were addressed. Care plan was outlined, including follow-up with PCP/specialist and return precautions were discussed. Patient is felt to be stable for discharge at this time.     Medications   ondansetron (ZOFRAN ODT) tablet 4 mg (4 mg Oral Given 09/14/18 1127)            Procedures    Dispo: home      ICD-10-CM ICD-9-CM    1. Dizziness R42 780.4    2. Nausea and vomiting in adult R11.2 787.01

## 2018-09-14 NOTE — ED Notes (Signed)
Dr.  Dolores FrameGuerra at beside

## 2018-09-14 NOTE — ED Notes (Signed)
Pt taken to CT scan

## 2018-09-14 NOTE — ED Notes (Signed)
Patient returned from CT scan.

## 2018-09-14 NOTE — ED Notes (Signed)
Hx vertigo in 2014. Had some old meclizine. Woke with vertigo. Tried taking the meclizine, keeps throwing it up. Dizziness continues. Has not taken BP meds

## 2018-09-14 NOTE — ED Notes (Signed)
Discharge and follow up reviewed by Cletis Athens, RN. Patient denies intolerable pain.

## 2018-09-14 NOTE — ED Notes (Signed)
Discharge and follow up reviewed by Alliyson, RN. Patient denies intolerable pain.

## 2018-09-14 NOTE — ED Triage Notes (Signed)
Hx vertigo in 2014. Had some old meclizine. Woke with vertigo. Tried taking the meclizine, keeps throwing it up. Dizziness continues. Has not taken BP meds

## 2018-09-14 NOTE — ED Notes (Addendum)
Dr.  Guerra at beside

## 2018-09-14 NOTE — ED Provider Notes (Signed)
HPI     37 year old female presents with acute onset spinning dizziness upon waking up after sleeping on her left side overnight similar to prior episode of vertigo in 2014.  She reports gait imbalance while walking to and from the bathroom this morning.  Patient tried to take meclizine for her symptoms which was an old prescription from her last episode but vomited the medication up on 2 different occasions.  She did report a mild diffuse headache with history of frequent similar headaches in the past which was relieved by taking BC powder which she did not vomit afterwards.  Her symptoms are greatly improved at this time.    Past Medical History:   Diagnosis Date   ??? Hypertension        Past Surgical History:   Procedure Laterality Date   ??? HX CESAREAN SECTION     ??? HX OTHER SURGICAL      tummy tuck         History reviewed. No pertinent family history.    Social History     Socioeconomic History   ??? Marital status: MARRIED     Spouse name: Not on file   ??? Number of children: Not on file   ??? Years of education: Not on file   ??? Highest education level: Not on file   Occupational History   ??? Not on file   Social Needs   ??? Financial resource strain: Not on file   ??? Food insecurity:     Worry: Not on file     Inability: Not on file   ??? Transportation needs:     Medical: Not on file     Non-medical: Not on file   Tobacco Use   ??? Smoking status: Never Smoker   ??? Smokeless tobacco: Never Used   Substance and Sexual Activity   ??? Alcohol use: No   ??? Drug use: No   ??? Sexual activity: Not on file   Lifestyle   ??? Physical activity:     Days per week: Not on file     Minutes per session: Not on file   ??? Stress: Not on file   Relationships   ??? Social connections:     Talks on phone: Not on file     Gets together: Not on file     Attends religious service: Not on file     Active member of club or organization: Not on file     Attends meetings of clubs or organizations: Not on file     Relationship status: Not on file    ??? Intimate partner violence:     Fear of current or ex partner: Not on file     Emotionally abused: Not on file     Physically abused: Not on file     Forced sexual activity: Not on file   Other Topics Concern   ??? Not on file   Social History Narrative   ??? Not on file         ALLERGIES: Pcn [penicillins]    Review of Systems   Constitutional: Negative for chills and fever.   HENT: Negative for ear pain and sore throat.    Eyes: Negative for pain and visual disturbance.   Respiratory: Negative for cough and shortness of breath.    Cardiovascular: Negative for chest pain and palpitations.   Gastrointestinal: Positive for nausea and vomiting. Negative for abdominal pain and diarrhea.   Genitourinary: Negative for flank pain.  Musculoskeletal: Positive for gait problem. Negative for back pain and neck pain.   Neurological: Positive for dizziness and headaches. Negative for syncope.   Psychiatric/Behavioral: Negative for agitation. The patient is not nervous/anxious.        Vitals:    09/14/18 0943 09/14/18 1133   BP: (!) 168/109 135/88   Pulse: 79 72   Resp: 16 16   Temp: 98.8 ??F (37.1 ??C)    SpO2: 100% 100%   Weight: 68 kg (150 lb)    Height: 5\' 3"  (1.6 m)             Physical Exam   Constitutional: She is oriented to person, place, and time. She appears well-developed and well-nourished. No distress.   HENT:   Head: Normocephalic and atraumatic.   Nose: Nose normal.   Mouth/Throat: Oropharynx is clear and moist.   Eyes: Pupils are equal, round, and reactive to light. Conjunctivae and EOM are normal. No scleral icterus.   Neck: Normal range of motion. Neck supple. No tracheal deviation present.   Cardiovascular: Normal rate, regular rhythm and intact distal pulses.   No murmur heard.  Pulmonary/Chest: Effort normal and breath sounds normal. No respiratory distress.   Abdominal: Soft. There is no tenderness.   Musculoskeletal: Normal range of motion. She exhibits no deformity.    Neurological: She is alert and oriented to person, place, and time. No cranial nerve deficit or sensory deficit. She exhibits normal muscle tone. Coordination normal.   No nystagmus.  Dix-Hallpike testing negative.  Drink 5 out of 5 in all extremities.  Normal gait and negative Romberg testing   Skin: Skin is warm and dry. Capillary refill takes less than 2 seconds. No rash noted. She is not diaphoretic.   Psychiatric: She has a normal mood and affect.   Nursing note and vitals reviewed.       MDM     Acute onset dizziness this morning upon waking associated with nausea vomiting and mild headache which has since resolved.  Patient symptoms similar to prior episode of vertigo in 2014.  On my evaluation she has no neurological deficits, symptoms are improved and Dix-Hallpike testing is negative.  Will give 1 dose of oral meclizine and renew her prescriptions in case her symptoms return upon going home.  Patient CT head was negative for acute intracranial abnormality, I am most suspicious for BPPV.    Patient has no new complaints, changes, or physical findings.   Diagnostic studies if preformed were reviewed with the patient and/or family.  All questions and concerns were addressed. Care plan was outlined, including follow-up with PCP/specialist and return precautions were discussed. Patient is felt to be stable for discharge at this time.     Medications   ondansetron (ZOFRAN ODT) tablet 4 mg (4 mg Oral Given 09/14/18 1127)            Procedures    Dispo: home      ICD-10-CM ICD-9-CM    1. Dizziness R42 780.4    2. Nausea and vomiting in adult R11.2 787.01

## 2018-09-14 NOTE — ED Notes (Signed)
Patient returned from CT scan

## 2019-05-27 ENCOUNTER — Inpatient Hospital Stay
Admit: 2019-05-27 | Discharge: 2019-05-27 | Disposition: A | Discharge status: Home / Self Care | Payer: TRICARE (CHAMPUS) | Attending: Emergency Medicine

## 2019-05-27 DIAGNOSIS — R1011 Right upper quadrant pain: Secondary | ICD-10-CM

## 2019-05-27 LAB — CBC WITH AUTO DIFFERENTIAL
Basophils %: 1 % (ref 0–2)
Basophils Absolute: 0 10*3/uL (ref 0.0–0.1)
Eosinophils %: 2 % (ref 0–5)
Eosinophils Absolute: 0.1 10*3/uL (ref 0.0–0.4)
Hematocrit: 35.3 % (ref 35.0–45.0)
Hemoglobin: 11.5 g/dL — ABNORMAL LOW (ref 12.0–16.0)
Lymphocytes %: 22 % (ref 21–52)
Lymphocytes Absolute: 0.9 10*3/uL (ref 0.9–3.6)
MCH: 27.8 PG (ref 24.0–34.0)
MCHC: 32.6 g/dL (ref 31.0–37.0)
MCV: 85.5 FL (ref 74.0–97.0)
MPV: 9.9 FL (ref 9.2–11.8)
Monocytes %: 1 % — ABNORMAL LOW (ref 3–10)
Monocytes Absolute: 0 10*3/uL — ABNORMAL LOW (ref 0.05–1.2)
Neutrophils %: 74 % — ABNORMAL HIGH (ref 40–73)
Neutrophils Absolute: 3.1 10*3/uL (ref 1.8–8.0)
Platelets: 249 10*3/uL (ref 135–420)
RBC: 4.13 M/uL — ABNORMAL LOW (ref 4.20–5.30)
RDW: 12.6 % (ref 11.6–14.5)
WBC: 4.1 10*3/uL — ABNORMAL LOW (ref 4.6–13.2)

## 2019-05-27 LAB — COMPREHENSIVE METABOLIC PANEL
ALT: 26 U/L (ref 13–56)
AST: 14 U/L (ref 10–38)
Albumin/Globulin Ratio: 1 (ref 0.8–1.7)
Albumin: 4.1 g/dL (ref 3.4–5.0)
Alkaline Phosphatase: 75 U/L (ref 45–117)
Anion Gap: 3 mmol/L (ref 3.0–18)
BUN: 9 MG/DL (ref 7.0–18)
Bun/Cre Ratio: 9 — ABNORMAL LOW (ref 12–20)
CO2: 29 mmol/L (ref 21–32)
Calcium: 8.7 MG/DL (ref 8.5–10.1)
Chloride: 107 mmol/L (ref 100–111)
Creatinine: 1.05 MG/DL (ref 0.6–1.3)
EGFR IF NonAfrican American: 58 mL/min/{1.73_m2} — ABNORMAL LOW (ref >60)
GFR African American: >60 mL/min/{1.73_m2} (ref >60)
Globulin: 4.1 g/dL — ABNORMAL HIGH (ref 2.0–4.0)
Glucose: 96 mg/dL (ref 74–99)
Potassium: 3.6 mmol/L (ref 3.5–5.5)
Sodium: 139 mmol/L (ref 136–145)
Total Bilirubin: 0.2 MG/DL (ref 0.2–1.0)
Total Protein: 8.2 g/dL (ref 6.4–8.2)

## 2019-05-27 LAB — URINALYSIS W/ RFLX MICROSCOPIC
Bilirubin, Urine: NEGATIVE
Bilirubin: NEGATIVE
Blood, Urine: NEGATIVE
Blood: NEGATIVE
Glucose, Ur: NEGATIVE mg/dL
Glucose: NEGATIVE mg/dL
Ketone: NEGATIVE mg/dL
Ketones, Urine: NEGATIVE mg/dL
Leukocyte Esterase, Urine: NEGATIVE
Leukocyte Esterase: NEGATIVE
Nitrite, Urine: NEGATIVE
Nitrites: NEGATIVE
Protein, UA: NEGATIVE mg/dL
Protein: NEGATIVE mg/dL
Specific Gravity, UA: 1.016 (ref 1.005–1.030)
Specific gravity: 1.016 (ref 1.005–1.030)
Urobilinogen, UA, POCT: 0.2 EU/dL (ref 0.2–1.0)
Urobilinogen: 0.2 EU/dL (ref 0.2–1.0)
pH (UA): 6 (ref 5.0–8.0)
pH, UA: 6 (ref 5.0–8.0)

## 2019-05-27 LAB — LIPASE
Lipase: 102 U/L (ref 73–393)
Lipase: 102 U/L (ref 73–393)

## 2019-05-27 LAB — HCG URINE, QL
HCG urine, QL: NEGATIVE
Pregnancy Test(Urn): NEGATIVE

## 2019-05-27 LAB — METABOLIC PANEL, COMPREHENSIVE
A-G Ratio: 1 (ref 0.8–1.7)
ALT (SGPT): 26 U/L (ref 13–56)
AST (SGOT): 14 U/L (ref 10–38)
Albumin: 4.1 g/dL (ref 3.4–5.0)
Alk. phosphatase: 75 U/L (ref 45–117)
Anion gap: 3 mmol/L (ref 3.0–18)
BUN/Creatinine ratio: 9 — ABNORMAL LOW (ref 12–20)
BUN: 9 MG/DL (ref 7.0–18)
Bilirubin, total: 0.2 MG/DL (ref 0.2–1.0)
CO2: 29 mmol/L (ref 21–32)
Calcium: 8.7 MG/DL (ref 8.5–10.1)
Chloride: 107 mmol/L (ref 100–111)
Creatinine: 1.05 MG/DL (ref 0.6–1.3)
GFR est AA: >60 mL/min/{1.73_m2} (ref >60)
GFR est non-AA: 58 mL/min/{1.73_m2} — ABNORMAL LOW (ref >60)
Globulin: 4.1 g/dL — ABNORMAL HIGH (ref 2.0–4.0)
Glucose: 96 mg/dL (ref 74–99)
Potassium: 3.6 mmol/L (ref 3.5–5.5)
Protein, total: 8.2 g/dL (ref 6.4–8.2)
Sodium: 139 mmol/L (ref 136–145)

## 2019-05-27 LAB — CBC WITH AUTOMATED DIFF
ABS. BASOPHILS: 0 10*3/uL (ref 0.0–0.1)
ABS. EOSINOPHILS: 0.1 10*3/uL (ref 0.0–0.4)
ABS. LYMPHOCYTES: 0.9 10*3/uL (ref 0.9–3.6)
ABS. MONOCYTES: 0 10*3/uL — ABNORMAL LOW (ref 0.05–1.2)
ABS. NEUTROPHILS: 3.1 10*3/uL (ref 1.8–8.0)
BASOPHILS: 1 % (ref 0–2)
EOSINOPHILS: 2 % (ref 0–5)
HCT: 35.3 % (ref 35.0–45.0)
HGB: 11.5 g/dL — ABNORMAL LOW (ref 12.0–16.0)
LYMPHOCYTES: 22 % (ref 21–52)
MCH: 27.8 PG (ref 24.0–34.0)
MCHC: 32.6 g/dL (ref 31.0–37.0)
MCV: 85.5 FL (ref 74.0–97.0)
MONOCYTES: 1 % — ABNORMAL LOW (ref 3–10)
MPV: 9.9 FL (ref 9.2–11.8)
NEUTROPHILS: 74 % — ABNORMAL HIGH (ref 40–73)
PLATELET: 249 10*3/uL (ref 135–420)
RBC: 4.13 M/uL — ABNORMAL LOW (ref 4.20–5.30)
RDW: 12.6 % (ref 11.6–14.5)
WBC: 4.1 10*3/uL — ABNORMAL LOW (ref 4.6–13.2)

## 2019-05-27 MED ORDER — KETOROLAC TROMETHAMINE 30 MG/ML INJECTION
30 mg/mL (1 mL) | INTRAMUSCULAR | Status: DC
Start: 2019-05-27 — End: 2019-05-27

## 2019-05-27 MED ORDER — ACETAMINOPHEN 500 MG TAB
500 mg | ORAL | Status: AC
Start: 2019-05-27 — End: 2019-05-27
  Administered 2019-05-27: 08:00:00 via ORAL

## 2019-05-27 MED ORDER — NAPROXEN 500 MG TAB
500 mg | ORAL_TABLET | Freq: Two times a day (BID) | ORAL | 0 refills | Status: AC
Start: 2019-05-27 — End: 2019-06-06

## 2019-05-27 MED ORDER — ACETAMINOPHEN 500 MG TAB
500 mg | ORAL_TABLET | Freq: Four times a day (QID) | ORAL | 0 refills | Status: DC | PRN
Start: 2019-05-27 — End: 2019-07-10

## 2019-05-27 MED FILL — KETOROLAC TROMETHAMINE 30 MG/ML INJECTION: 30 mg/mL (1 mL) | INTRAMUSCULAR | Qty: 1

## 2019-05-27 MED FILL — MAPAP EXTRA STRENGTH 500 MG TABLET: 500 mg | ORAL | Qty: 2

## 2019-05-27 NOTE — ED Provider Notes (Signed)
Nicole Watson is a 38 y.o. female with history of hypertension with complaints of abrupt onset of right upper quadrant pain in her abdomen that started within the last hour and woke her up.  Patient has any associated nausea, vomiting, diarrhea, sweats, recent urinary symptoms to include dysuria, frequency urgency or hematuria.  She has had no recent fever or cough.  She is never had pain like this before in the past.  She felt well yesterday.  She not taking thing for the pain.  Patient felt intermittent tightness in the area of discomfort that came and went.  No known history of previous gallbladder issues.  No history of kidney stones.    The history is provided by the patient.        Past Medical History:   Diagnosis Date   ??? Hypertension        Past Surgical History:   Procedure Laterality Date   ??? HX CESAREAN SECTION     ??? HX OTHER SURGICAL      tummy tuck         History reviewed. No pertinent family history.    Social History     Socioeconomic History   ??? Marital status: MARRIED     Spouse name: Not on file   ??? Number of children: Not on file   ??? Years of education: Not on file   ??? Highest education level: Not on file   Occupational History   ??? Not on file   Social Needs   ??? Financial resource strain: Not on file   ??? Food insecurity     Worry: Not on file     Inability: Not on file   ??? Transportation needs     Medical: Not on file     Non-medical: Not on file   Tobacco Use   ??? Smoking status: Never Smoker   ??? Smokeless tobacco: Never Used   Substance and Sexual Activity   ??? Alcohol use: No   ??? Drug use: No   ??? Sexual activity: Not on file   Lifestyle   ??? Physical activity     Days per week: Not on file     Minutes per session: Not on file   ??? Stress: Not on file   Relationships   ??? Social Product manager on phone: Not on file     Gets together: Not on file     Attends religious service: Not on file     Active member of club or organization: Not on file     Attends meetings of clubs or  organizations: Not on file     Relationship status: Not on file   ??? Intimate partner violence     Fear of current or ex partner: Not on file     Emotionally abused: Not on file     Physically abused: Not on file     Forced sexual activity: Not on file   Other Topics Concern   ??? Not on file   Social History Narrative   ??? Not on file         ALLERGIES: Pcn [penicillins]    Review of Systems   Constitutional: Negative for fever.   HENT: Negative for sore throat.    Eyes: Negative for visual disturbance.   Respiratory: Negative for shortness of breath.    Cardiovascular: Negative for chest pain.   Gastrointestinal: Positive for abdominal pain.   Genitourinary: Negative for difficulty  urinating and pelvic pain.   Musculoskeletal: Negative for back pain.   Skin: Negative for rash.   Allergic/Immunologic: Negative for immunocompromised state.   Neurological: Negative for headaches.   Psychiatric/Behavioral: Positive for sleep disturbance.       Vitals:    05/27/19 0230   BP: (!) 156/107   Pulse: 90   Resp: 16   Temp: 98.5 ??F (36.9 ??C)   SpO2: 100%   Weight: 70.3 kg (155 lb)   Height: '5\' 3"'  (1.6 m)            Physical Exam  Vitals signs and nursing note reviewed.   Constitutional:       General: She is not in acute distress.     Appearance: She is well-developed. She is not ill-appearing, toxic-appearing or diaphoretic.   HENT:      Head: Normocephalic and atraumatic.      Right Ear: External ear normal.      Left Ear: External ear normal.      Nose: Nose normal.      Mouth/Throat:      Pharynx: Uvula midline.   Eyes:      General: No scleral icterus.     Conjunctiva/sclera: Conjunctivae normal.   Neck:      Musculoskeletal: Neck supple.   Cardiovascular:      Rate and Rhythm: Normal rate and regular rhythm.      Heart sounds: Normal heart sounds.   Pulmonary:      Effort: Pulmonary effort is normal.      Breath sounds: Normal breath sounds.   Abdominal:      General: There is no distension.      Palpations: Abdomen is  soft.      Tenderness: There is no abdominal tenderness. There is no guarding or rebound.   Musculoskeletal:      Right lower leg: No edema.      Left lower leg: No edema.   Skin:     General: Skin is warm and dry.      Capillary Refill: Capillary refill takes less than 2 seconds.   Neurological:      Mental Status: She is alert and oriented to person, place, and time.      Gait: Gait normal.   Psychiatric:         Behavior: Behavior normal.          MDM       Procedures    Vitals:  Patient Vitals for the past 12 hrs:   Temp Pulse Resp BP SpO2   05/27/19 0230 98.5 ??F (36.9 ??C) 90 16 (!) 156/107 100 %         Medications ordered:   Medications   ketorolac (TORADOL) injection 30 mg (0 mg IntraVENous Held 05/27/19 0341)   acetaminophen (TYLENOL) tablet 1,000 mg (1,000 mg Oral Given 05/27/19 0341)         Lab findings:  Recent Results (from the past 12 hour(s))   URINALYSIS W/ RFLX MICROSCOPIC    Collection Time: 05/27/19  2:33 AM   Result Value Ref Range    Color YELLOW      Appearance CLEAR      Specific gravity 1.016 1.005 - 1.030      pH (UA) 6.0 5.0 - 8.0      Protein Negative NEG mg/dL    Glucose Negative NEG mg/dL    Ketone Negative NEG mg/dL    Bilirubin Negative NEG      Blood  Negative NEG      Urobilinogen 0.2 0.2 - 1.0 EU/dL    Nitrites Negative NEG      Leukocyte Esterase Negative NEG     HCG URINE, QL    Collection Time: 05/27/19  2:33 AM   Result Value Ref Range    HCG urine, QL Negative NEG     CBC WITH AUTOMATED DIFF    Collection Time: 05/27/19  3:29 AM   Result Value Ref Range    WBC 4.1 (L) 4.6 - 13.2 K/uL    RBC 4.13 (L) 4.20 - 5.30 M/uL    HGB 11.5 (L) 12.0 - 16.0 g/dL    HCT 35.3 35.0 - 45.0 %    MCV 85.5 74.0 - 97.0 FL    MCH 27.8 24.0 - 34.0 PG    MCHC 32.6 31.0 - 37.0 g/dL    RDW 12.6 11.6 - 14.5 %    PLATELET 249 135 - 420 K/uL    MPV 9.9 9.2 - 11.8 FL    NEUTROPHILS 74 (H) 40 - 73 %    LYMPHOCYTES 22 21 - 52 %    MONOCYTES 1 (L) 3 - 10 %    EOSINOPHILS 2 0 - 5 %    BASOPHILS 1 0 - 2 %    ABS.  NEUTROPHILS 3.1 1.8 - 8.0 K/UL    ABS. LYMPHOCYTES 0.9 0.9 - 3.6 K/UL    ABS. MONOCYTES 0.0 (L) 0.05 - 1.2 K/UL    ABS. EOSINOPHILS 0.1 0.0 - 0.4 K/UL    ABS. BASOPHILS 0.0 0.0 - 0.1 K/UL    DF AUTOMATED     METABOLIC PANEL, COMPREHENSIVE    Collection Time: 05/27/19  3:29 AM   Result Value Ref Range    Sodium 139 136 - 145 mmol/L    Potassium 3.6 3.5 - 5.5 mmol/L    Chloride 107 100 - 111 mmol/L    CO2 29 21 - 32 mmol/L    Anion gap 3 3.0 - 18 mmol/L    Glucose 96 74 - 99 mg/dL    BUN 9 7.0 - 18 MG/DL    Creatinine 1.05 0.6 - 1.3 MG/DL    BUN/Creatinine ratio 9 (L) 12 - 20      GFR est AA >60 >60 ml/min/1.12m    GFR est non-AA 58 (L) >60 ml/min/1.757m   Calcium 8.7 8.5 - 10.1 MG/DL    Bilirubin, total 0.2 0.2 - 1.0 MG/DL    ALT (SGPT) 26 13 - 56 U/L    AST (SGOT) 14 10 - 38 U/L    Alk. phosphatase 75 45 - 117 U/L    Protein, total 8.2 6.4 - 8.2 g/dL    Albumin 4.1 3.4 - 5.0 g/dL    Globulin 4.1 (H) 2.0 - 4.0 g/dL    A-G Ratio 1.0 0.8 - 1.7     LIPASE    Collection Time: 05/27/19  3:29 AM   Result Value Ref Range    Lipase 102 73 - 393 U/L       EKG interpretation by ED Physician:      X-Ray, CT or other radiology findings or impressions:  No orders to display       Progress notes, Consult notes or additional Procedure notes:   Patient resting comfortably in bed.  Really no significant pain on exam and repeat exam.  Do not feel patient requires imaging or other work-up at this time    I have discussed with  patient and/or family/sig other the results, interpretation of any imaging if performed, suspected diagnosis and treatment plan to include instructions regarding the diagnoses listed to which understanding was expressed with all questions answered      Reevaluation of patient:   stable    Disposition:  Diagnosis:   1. Acute abdominal pain in right upper quadrant        Disposition: home    Follow-up Information     Follow up With Specialties Details Why Contact Info    follow up with your assigned military  primary care provider this week for recheck        For any worsening pain, fever, go directly to [facility] for further evaluation                Patient's Medications   Start Taking    ACETAMINOPHEN (TYLENOL EXTRA STRENGTH) 500 MG TABLET    Take 2 Tabs by mouth every six (6) hours as needed for Pain.    NAPROXEN (NAPROSYN) 500 MG TABLET    Take 1 Tab by mouth two (2) times daily (with meals) for 10 days.   Continue Taking    ERGOCALCIFEROL (VITAMIN D2) 50,000 UNIT CAPSULE    Take 50,000 Units by mouth.    LOSARTAN (COZAAR) 100 MG TABLET    Take 100 mg by mouth daily.    MULTIVITAMIN (ONE A DAY) TABLET    Take 1 tablet by mouth daily.   These Medications have changed    No medications on file   Stop Taking    LISINOPRIL (PRINIVIL, ZESTRIL) 20 MG TABLET    Take 20 mg by mouth daily.    MECLIZINE (ANTIVERT) 25 MG TABLET    Take 1 tablet by mouth every 6 hours for the next 3 days.  Then stop taking the meclizine.  Restart the meclizine for 3 day intervals if vertigo/ dizziness returns.    ONDANSETRON (ZOFRAN ODT) 4 MG DISINTEGRATING TABLET    Take 1-2 tablets every 6-8 hours as needed for nausea and vomiting.

## 2019-05-27 NOTE — ED Notes (Signed)
Patient comes in complaining of RUQ abdominal pain that woke her up out of her sleep. Patient states that she has never had this pain before and she was fine all day yesterday. Denies N/V/D.

## 2019-05-27 NOTE — ED Notes (Signed)
I have reviewed discharge instructions with the patient.  The patient verbalized understanding.    Patient armband removed and shredded    Patient ambulatory to ED lobby.

## 2019-05-27 NOTE — ED Triage Notes (Signed)
Patient comes in complaining of RUQ abdominal pain that woke her up out of her sleep. Patient states that she has never had this pain before and she was fine all day yesterday. Denies N/V/D.

## 2019-05-27 NOTE — ED Provider Notes (Signed)
Nicole Watson is a 38 y.o. female with history of hypertension with complaints of abrupt onset of right upper quadrant pain in her abdomen that started within the last hour and woke her up.  Patient has any associated nausea, vomiting, diarrhea, sweats, recent urinary symptoms to include dysuria, frequency urgency or hematuria.  She has had no recent fever or cough.  She is never had pain like this before in the past.  She felt well yesterday.  She not taking thing for the pain.  Patient felt intermittent tightness in the area of discomfort that came and went.  No known history of previous gallbladder issues.  No history of kidney stones.    The history is provided by the patient.        Past Medical History:   Diagnosis Date   ??? Hypertension        Past Surgical History:   Procedure Laterality Date   ??? HX CESAREAN SECTION     ??? HX OTHER SURGICAL      tummy tuck         History reviewed. No pertinent family history.    Social History     Socioeconomic History   ??? Marital status: MARRIED     Spouse name: Not on file   ??? Number of children: Not on file   ??? Years of education: Not on file   ??? Highest education level: Not on file   Occupational History   ??? Not on file   Social Needs   ??? Financial resource strain: Not on file   ??? Food insecurity     Worry: Not on file     Inability: Not on file   ??? Transportation needs     Medical: Not on file     Non-medical: Not on file   Tobacco Use   ??? Smoking status: Never Smoker   ??? Smokeless tobacco: Never Used   Substance and Sexual Activity   ??? Alcohol use: No   ??? Drug use: No   ??? Sexual activity: Not on file   Lifestyle   ??? Physical activity     Days per week: Not on file     Minutes per session: Not on file   ??? Stress: Not on file   Relationships   ??? Social Product manager on phone: Not on file     Gets together: Not on file     Attends religious service: Not on file     Active member of club or organization: Not on file      Attends meetings of clubs or organizations: Not on file     Relationship status: Not on file   ??? Intimate partner violence     Fear of current or ex partner: Not on file     Emotionally abused: Not on file     Physically abused: Not on file     Forced sexual activity: Not on file   Other Topics Concern   ??? Not on file   Social History Narrative   ??? Not on file         ALLERGIES: Pcn [penicillins]    Review of Systems   Constitutional: Negative for fever.   HENT: Negative for sore throat.    Eyes: Negative for visual disturbance.   Respiratory: Negative for shortness of breath.    Cardiovascular: Negative for chest pain.   Gastrointestinal: Positive for abdominal pain.   Genitourinary: Negative for difficulty  urinating and pelvic pain.   Musculoskeletal: Negative for back pain.   Skin: Negative for rash.   Allergic/Immunologic: Negative for immunocompromised state.   Neurological: Negative for headaches.   Psychiatric/Behavioral: Positive for sleep disturbance.       Vitals:    05/27/19 0230   BP: (!) 156/107   Pulse: 90   Resp: 16   Temp: 98.5 ??F (36.9 ??C)   SpO2: 100%   Weight: 70.3 kg (155 lb)   Height: '5\' 3"'$  (1.6 m)            Physical Exam  Vitals signs and nursing note reviewed.   Constitutional:       General: She is not in acute distress.     Appearance: She is well-developed. She is not ill-appearing, toxic-appearing or diaphoretic.   HENT:      Head: Normocephalic and atraumatic.      Right Ear: External ear normal.      Left Ear: External ear normal.      Nose: Nose normal.      Mouth/Throat:      Pharynx: Uvula midline.   Eyes:      General: No scleral icterus.     Conjunctiva/sclera: Conjunctivae normal.   Neck:      Musculoskeletal: Neck supple.   Cardiovascular:      Rate and Rhythm: Normal rate and regular rhythm.      Heart sounds: Normal heart sounds.   Pulmonary:      Effort: Pulmonary effort is normal.      Breath sounds: Normal breath sounds.   Abdominal:      General: There is no distension.       Palpations: Abdomen is soft.      Tenderness: There is no abdominal tenderness. There is no guarding or rebound.   Musculoskeletal:      Right lower leg: No edema.      Left lower leg: No edema.   Skin:     General: Skin is warm and dry.      Capillary Refill: Capillary refill takes less than 2 seconds.   Neurological:      Mental Status: She is alert and oriented to person, place, and time.      Gait: Gait normal.   Psychiatric:         Behavior: Behavior normal.          MDM       Procedures    Vitals:  Patient Vitals for the past 12 hrs:   Temp Pulse Resp BP SpO2   05/27/19 0230 98.5 ??F (36.9 ??C) 90 16 (!) 156/107 100 %         Medications ordered:   Medications   ketorolac (TORADOL) injection 30 mg (0 mg IntraVENous Held 05/27/19 0341)   acetaminophen (TYLENOL) tablet 1,000 mg (1,000 mg Oral Given 05/27/19 0341)         Lab findings:  Recent Results (from the past 12 hour(s))   URINALYSIS W/ RFLX MICROSCOPIC    Collection Time: 05/27/19  2:33 AM   Result Value Ref Range    Color YELLOW      Appearance CLEAR      Specific gravity 1.016 1.005 - 1.030      pH (UA) 6.0 5.0 - 8.0      Protein Negative NEG mg/dL    Glucose Negative NEG mg/dL    Ketone Negative NEG mg/dL    Bilirubin Negative NEG      Blood  Negative NEG      Urobilinogen 0.2 0.2 - 1.0 EU/dL    Nitrites Negative NEG      Leukocyte Esterase Negative NEG     HCG URINE, QL    Collection Time: 05/27/19  2:33 AM   Result Value Ref Range    HCG urine, QL Negative NEG     CBC WITH AUTOMATED DIFF    Collection Time: 05/27/19  3:29 AM   Result Value Ref Range    WBC 4.1 (L) 4.6 - 13.2 K/uL    RBC 4.13 (L) 4.20 - 5.30 M/uL    HGB 11.5 (L) 12.0 - 16.0 g/dL    HCT 35.3 35.0 - 45.0 %    MCV 85.5 74.0 - 97.0 FL    MCH 27.8 24.0 - 34.0 PG    MCHC 32.6 31.0 - 37.0 g/dL    RDW 12.6 11.6 - 14.5 %    PLATELET 249 135 - 420 K/uL    MPV 9.9 9.2 - 11.8 FL    NEUTROPHILS 74 (H) 40 - 73 %    LYMPHOCYTES 22 21 - 52 %    MONOCYTES 1 (L) 3 - 10 %    EOSINOPHILS 2 0 - 5 %     BASOPHILS 1 0 - 2 %    ABS. NEUTROPHILS 3.1 1.8 - 8.0 K/UL    ABS. LYMPHOCYTES 0.9 0.9 - 3.6 K/UL    ABS. MONOCYTES 0.0 (L) 0.05 - 1.2 K/UL    ABS. EOSINOPHILS 0.1 0.0 - 0.4 K/UL    ABS. BASOPHILS 0.0 0.0 - 0.1 K/UL    DF AUTOMATED     METABOLIC PANEL, COMPREHENSIVE    Collection Time: 05/27/19  3:29 AM   Result Value Ref Range    Sodium 139 136 - 145 mmol/L    Potassium 3.6 3.5 - 5.5 mmol/L    Chloride 107 100 - 111 mmol/L    CO2 29 21 - 32 mmol/L    Anion gap 3 3.0 - 18 mmol/L    Glucose 96 74 - 99 mg/dL    BUN 9 7.0 - 18 MG/DL    Creatinine 1.05 0.6 - 1.3 MG/DL    BUN/Creatinine ratio 9 (L) 12 - 20      GFR est AA >60 >60 ml/min/1.55m    GFR est non-AA 58 (L) >60 ml/min/1.771m   Calcium 8.7 8.5 - 10.1 MG/DL    Bilirubin, total 0.2 0.2 - 1.0 MG/DL    ALT (SGPT) 26 13 - 56 U/L    AST (SGOT) 14 10 - 38 U/L    Alk. phosphatase 75 45 - 117 U/L    Protein, total 8.2 6.4 - 8.2 g/dL    Albumin 4.1 3.4 - 5.0 g/dL    Globulin 4.1 (H) 2.0 - 4.0 g/dL    A-G Ratio 1.0 0.8 - 1.7     LIPASE    Collection Time: 05/27/19  3:29 AM   Result Value Ref Range    Lipase 102 73 - 393 U/L       EKG interpretation by ED Physician:      X-Ray, CT or other radiology findings or impressions:  No orders to display       Progress notes, Consult notes or additional Procedure notes:   Patient resting comfortably in bed.  Really no significant pain on exam and repeat exam.  Do not feel patient requires imaging or other work-up at this time    I have discussed with  patient and/or family/sig other the results, interpretation of any imaging if performed, suspected diagnosis and treatment plan to include instructions regarding the diagnoses listed to which understanding was expressed with all questions answered      Reevaluation of patient:   stable    Disposition:  Diagnosis:   1. Acute abdominal pain in right upper quadrant        Disposition: home    Follow-up Information     Follow up With Specialties Details Why Contact Info     follow up with your assigned military primary care provider this week for recheck        For any worsening pain, fever, go directly to [facility] for further evaluation                Patient's Medications   Start Taking    ACETAMINOPHEN (TYLENOL EXTRA STRENGTH) 500 MG TABLET    Take 2 Tabs by mouth every six (6) hours as needed for Pain.    NAPROXEN (NAPROSYN) 500 MG TABLET    Take 1 Tab by mouth two (2) times daily (with meals) for 10 days.   Continue Taking    ERGOCALCIFEROL (VITAMIN D2) 50,000 UNIT CAPSULE    Take 50,000 Units by mouth.    LOSARTAN (COZAAR) 100 MG TABLET    Take 100 mg by mouth daily.    MULTIVITAMIN (ONE A DAY) TABLET    Take 1 tablet by mouth daily.   These Medications have changed    No medications on file   Stop Taking    LISINOPRIL (PRINIVIL, ZESTRIL) 20 MG TABLET    Take 20 mg by mouth daily.    MECLIZINE (ANTIVERT) 25 MG TABLET    Take 1 tablet by mouth every 6 hours for the next 3 days.  Then stop taking the meclizine.  Restart the meclizine for 3 day intervals if vertigo/ dizziness returns.    ONDANSETRON (ZOFRAN ODT) 4 MG DISINTEGRATING TABLET    Take 1-2 tablets every 6-8 hours as needed for nausea and vomiting.

## 2019-05-27 NOTE — ED Notes (Signed)
I have reviewed discharge instructions with the patient.  The patient verbalized understanding.    Patient armband removed and shredded    Patient ambulatory to ED lobby.

## 2019-07-09 ENCOUNTER — Emergency Department: Admit: 2019-07-10 | Payer: TRICARE (CHAMPUS) | Primary: Family Medicine

## 2019-07-09 DIAGNOSIS — S8992XA Unspecified injury of left lower leg, initial encounter: Secondary | ICD-10-CM

## 2019-07-09 NOTE — ED Provider Notes (Signed)
KYTZIA Watson is a 38 y.o. female with no significant past medical history other than hypertension with complaints of left knee injury after going up the stairs tonight.  Patient felt a pop and felt pain on the lateral aspect.  She had difficulty bearing weight afterwards.  There is some swelling.  She denies any other injury.  No previous knee surgery there.  She not taking thing for the pain.  It is worse with movement.    The history is provided by the patient.        Past Medical History:   Diagnosis Date   ??? Hypertension        Past Surgical History:   Procedure Laterality Date   ??? HX CESAREAN SECTION     ??? HX OTHER SURGICAL      tummy tuck         No family history on file.    Social History     Socioeconomic History   ??? Marital status: MARRIED     Spouse name: Not on file   ??? Number of children: Not on file   ??? Years of education: Not on file   ??? Highest education level: Not on file   Occupational History   ??? Not on file   Social Needs   ??? Financial resource strain: Not on file   ??? Food insecurity     Worry: Not on file     Inability: Not on file   ??? Transportation needs     Medical: Not on file     Non-medical: Not on file   Tobacco Use   ??? Smoking status: Never Smoker   ??? Smokeless tobacco: Never Used   Substance and Sexual Activity   ??? Alcohol use: No   ??? Drug use: No   ??? Sexual activity: Not on file   Lifestyle   ??? Physical activity     Days per week: Not on file     Minutes per session: Not on file   ??? Stress: Not on file   Relationships   ??? Social Product manager on phone: Not on file     Gets together: Not on file     Attends religious service: Not on file     Active member of club or organization: Not on file     Attends meetings of clubs or organizations: Not on file     Relationship status: Not on file   ??? Intimate partner violence     Fear of current or ex partner: Not on file     Emotionally abused: Not on file     Physically abused: Not on file     Forced sexual activity: Not on file    Other Topics Concern   ??? Not on file   Social History Narrative   ??? Not on file         ALLERGIES: Pcn [penicillins]    Review of Systems   Constitutional: Negative for fever.   Respiratory: Negative for shortness of breath.    Cardiovascular: Negative for chest pain.   Gastrointestinal: Negative for abdominal pain.   Musculoskeletal: Positive for arthralgias, gait problem and joint swelling.   Skin: Negative for color change and rash.   Neurological: Negative for syncope.   Psychiatric/Behavioral: Positive for sleep disturbance.       Vitals:    07/09/19 2307   BP: (!) 141/109   Pulse: (!) 104   Resp: 16  Temp: 98 ??F (36.7 ??C)   SpO2: 100%   Weight: 70.3 kg (154 lb 15.7 oz)            Physical Exam  Vitals signs and nursing note reviewed.   Constitutional:       General: She is in acute distress.      Appearance: She is well-developed. She is not ill-appearing, toxic-appearing or diaphoretic.   HENT:      Head: Normocephalic and atraumatic.      Right Ear: External ear normal.      Left Ear: External ear normal.      Nose: Nose normal.      Mouth/Throat:      Pharynx: Uvula midline.   Eyes:      General: No scleral icterus.     Conjunctiva/sclera: Conjunctivae normal.   Neck:      Musculoskeletal: Neck supple.   Cardiovascular:      Rate and Rhythm: Normal rate and regular rhythm.      Heart sounds: Normal heart sounds.   Pulmonary:      Effort: Pulmonary effort is normal.      Breath sounds: Normal breath sounds.   Musculoskeletal:      Left knee: She exhibits decreased range of motion, swelling and LCL laxity. She exhibits no effusion, no ecchymosis, no deformity, no laceration, no erythema, normal alignment, normal patellar mobility, no bony tenderness, normal meniscus and no MCL laxity. Tenderness found. Lateral joint line and LCL tenderness noted. No medial joint line, no MCL and no patellar tendon tenderness noted.      Comments: Normal distal pulse and sensation.  No distal swelling.   Skin:     General:  Skin is warm and dry.   Neurological:      Mental Status: She is alert and oriented to person, place, and time.      Gait: Gait normal.   Psychiatric:         Behavior: Behavior normal.          MDM       Procedures  Vitals:  Patient Vitals for the past 12 hrs:   Temp Pulse Resp BP SpO2   07/09/19 2307 98 ??F (36.7 ??C) (!) 104 16 (!) 141/109 100 %         Medications ordered:   Medications   acetaminophen (TYLENOL) tablet 1,000 mg (1,000 mg Oral Given 07/09/19 2355)   ibuprofen (MOTRIN) tablet 600 mg (600 mg Oral Given 07/09/19 2355)         Lab findings:  No results found for this or any previous visit (from the past 12 hour(s)).    EKG interpretation by ED Physician:      X-Ray, CT or other radiology findings or impressions:  XR KNEE LT MIN 4 V    (Results Pending)   No acute process with exception of possible effusion    Progress notes, Consult notes or additional Procedure notes:   Suspect possible LCL or lateral meniscus tear.  Also possible of mild ACL tear as well.  Do not feel patient has quadricep or patellar tendon injury.  Patient was placed in a knee hinge brace by nurse with MD supervision neurovascular exam intact along with crutches    I have discussed with patient and/or family/sig other the results, interpretation of any imaging if performed, suspected diagnosis and treatment plan to include instructions regarding the diagnoses listed to which understanding was expressed with all questions answered  Reevaluation of patient:   stable    Disposition:  Diagnosis:   1. Injury of left knee, initial encounter        Disposition: home    Follow-up Information     Follow up With Specialties Details Why Contact Info    [facility]  Schedule an appointment as soon as possible for a visit  [address]  [address]  [place] [id number]  [phone]            Patient's Medications   Start Taking    NAPROXEN (NAPROSYN) 500 MG TABLET    Take 1 Tab by mouth two (2) times daily (with  meals) for 10 days.   Continue Taking    ERGOCALCIFEROL (VITAMIN D2) 50,000 UNIT CAPSULE    Take 50,000 Units by mouth.    LOSARTAN (COZAAR) 100 MG TABLET    Take 100 mg by mouth daily.    MULTIVITAMIN (ONE A DAY) TABLET    Take 1 tablet by mouth daily.   These Medications have changed    Modified Medication Previous Medication    ACETAMINOPHEN (TYLENOL EXTRA STRENGTH) 500 MG TABLET acetaminophen (Tylenol Extra Strength) 500 mg tablet       Take 2 Tabs by mouth every six (6) hours as needed for Pain.    Take 2 Tabs by mouth every six (6) hours as needed for Pain.   Stop Taking    No medications on file

## 2019-07-09 NOTE — ED Notes (Signed)
Pt states her left knee buckled going up the stairs today. Is complaining of pain to same

## 2019-07-09 NOTE — ED Triage Notes (Signed)
Pt states her left knee buckled going up the stairs today. Is complaining of pain to same

## 2019-07-09 NOTE — ED Provider Notes (Signed)
Nicole Watson is a 38 y.o. female with no significant past medical history other than hypertension with complaints of left knee injury after going up the stairs tonight.  Patient felt a pop and felt pain on the lateral aspect.  She had difficulty bearing weight afterwards.  There is some swelling.  She denies any other injury.  No previous knee surgery there.  She not taking thing for the pain.  It is worse with movement.    The history is provided by the patient.        Past Medical History:   Diagnosis Date   ??? Hypertension        Past Surgical History:   Procedure Laterality Date   ??? HX CESAREAN SECTION     ??? HX OTHER SURGICAL      tummy tuck         No family history on file.    Social History     Socioeconomic History   ??? Marital status: MARRIED     Spouse name: Not on file   ??? Number of children: Not on file   ??? Years of education: Not on file   ??? Highest education level: Not on file   Occupational History   ??? Not on file   Social Needs   ??? Financial resource strain: Not on file   ??? Food insecurity     Worry: Not on file     Inability: Not on file   ??? Transportation needs     Medical: Not on file     Non-medical: Not on file   Tobacco Use   ??? Smoking status: Never Smoker   ??? Smokeless tobacco: Never Used   Substance and Sexual Activity   ??? Alcohol use: No   ??? Drug use: No   ??? Sexual activity: Not on file   Lifestyle   ??? Physical activity     Days per week: Not on file     Minutes per session: Not on file   ??? Stress: Not on file   Relationships   ??? Social Product manager on phone: Not on file     Gets together: Not on file     Attends religious service: Not on file     Active member of club or organization: Not on file     Attends meetings of clubs or organizations: Not on file     Relationship status: Not on file   ??? Intimate partner violence     Fear of current or ex partner: Not on file     Emotionally abused: Not on file     Physically abused: Not on file     Forced sexual activity: Not on file    Other Topics Concern   ??? Not on file   Social History Narrative   ??? Not on file         ALLERGIES: Pcn [penicillins]    Review of Systems   Constitutional: Negative for fever.   Respiratory: Negative for shortness of breath.    Cardiovascular: Negative for chest pain.   Gastrointestinal: Negative for abdominal pain.   Musculoskeletal: Positive for arthralgias, gait problem and joint swelling.   Skin: Negative for color change and rash.   Neurological: Negative for syncope.   Psychiatric/Behavioral: Positive for sleep disturbance.       Vitals:    07/09/19 2307   BP: (!) 141/109   Pulse: (!) 104   Resp: 16  Temp: 98 ??F (36.7 ??C)   SpO2: 100%   Weight: 70.3 kg (154 lb 15.7 oz)            Physical Exam  Vitals signs and nursing note reviewed.   Constitutional:       General: She is in acute distress.      Appearance: She is well-developed. She is not ill-appearing, toxic-appearing or diaphoretic.   HENT:      Head: Normocephalic and atraumatic.      Right Ear: External ear normal.      Left Ear: External ear normal.      Nose: Nose normal.      Mouth/Throat:      Pharynx: Uvula midline.   Eyes:      General: No scleral icterus.     Conjunctiva/sclera: Conjunctivae normal.   Neck:      Musculoskeletal: Neck supple.   Cardiovascular:      Rate and Rhythm: Normal rate and regular rhythm.      Heart sounds: Normal heart sounds.   Pulmonary:      Effort: Pulmonary effort is normal.      Breath sounds: Normal breath sounds.   Musculoskeletal:      Left knee: She exhibits decreased range of motion, swelling and LCL laxity. She exhibits no effusion, no ecchymosis, no deformity, no laceration, no erythema, normal alignment, normal patellar mobility, no bony tenderness, normal meniscus and no MCL laxity. Tenderness found. Lateral joint line and LCL tenderness noted. No medial joint line, no MCL and no patellar tendon tenderness noted.      Comments: Normal distal pulse and sensation.  No distal swelling.   Skin:      General: Skin is warm and dry.   Neurological:      Mental Status: She is alert and oriented to person, place, and time.      Gait: Gait normal.   Psychiatric:         Behavior: Behavior normal.          MDM       Procedures  Vitals:  Patient Vitals for the past 12 hrs:   Temp Pulse Resp BP SpO2   07/09/19 2307 98 ??F (36.7 ??C) (!) 104 16 (!) 141/109 100 %         Medications ordered:   Medications   acetaminophen (TYLENOL) tablet 1,000 mg (1,000 mg Oral Given 07/09/19 2355)   ibuprofen (MOTRIN) tablet 600 mg (600 mg Oral Given 07/09/19 2355)         Lab findings:  No results found for this or any previous visit (from the past 12 hour(s)).    EKG interpretation by ED Physician:      X-Ray, CT or other radiology findings or impressions:  XR KNEE LT MIN 4 V    (Results Pending)   No acute process with exception of possible effusion    Progress notes, Consult notes or additional Procedure notes:   Suspect possible LCL or lateral meniscus tear.  Also possible of mild ACL tear as well.  Do not feel patient has quadricep or patellar tendon injury.  Patient was placed in a knee hinge brace by nurse with MD supervision neurovascular exam intact along with crutches    I have discussed with patient and/or family/sig other the results, interpretation of any imaging if performed, suspected diagnosis and treatment plan to include instructions regarding the diagnoses listed to which understanding was expressed with all questions answered  Reevaluation of patient:   stable    Disposition:  Diagnosis:   1. Injury of left knee, initial encounter        Disposition: home    Follow-up Information     Follow up With Specialties Details Why Contact Info    [facility]  Schedule an appointment as soon as possible for a visit  [address]  [address]  [place] [id number]  [phone]            Patient's Medications   Start Taking    NAPROXEN (NAPROSYN) 500 MG TABLET    Take 1 Tab by mouth two (2) times  daily (with meals) for 10 days.   Continue Taking    ERGOCALCIFEROL (VITAMIN D2) 50,000 UNIT CAPSULE    Take 50,000 Units by mouth.    LOSARTAN (COZAAR) 100 MG TABLET    Take 100 mg by mouth daily.    MULTIVITAMIN (ONE A DAY) TABLET    Take 1 tablet by mouth daily.   These Medications have changed    Modified Medication Previous Medication    ACETAMINOPHEN (TYLENOL EXTRA STRENGTH) 500 MG TABLET acetaminophen (Tylenol Extra Strength) 500 mg tablet       Take 2 Tabs by mouth every six (6) hours as needed for Pain.    Take 2 Tabs by mouth every six (6) hours as needed for Pain.   Stop Taking    No medications on file

## 2019-07-10 ENCOUNTER — Inpatient Hospital Stay
Admit: 2019-07-10 | Discharge: 2019-07-10 | Disposition: A | Discharge status: Home / Self Care | Payer: TRICARE (CHAMPUS) | Attending: Emergency Medicine

## 2019-07-10 MED ORDER — ACETAMINOPHEN 500 MG TAB
500 mg | ORAL_TABLET | Freq: Four times a day (QID) | ORAL | 0 refills | Status: DC | PRN
Start: 2019-07-10 — End: 2019-08-15

## 2019-07-10 MED ORDER — ACETAMINOPHEN 500 MG TAB
500 mg | ORAL | Status: AC
Start: 2019-07-10 — End: 2019-07-09
  Administered 2019-07-10: 04:00:00 via ORAL

## 2019-07-10 MED ORDER — NAPROXEN 500 MG TAB
500 mg | ORAL_TABLET | Freq: Two times a day (BID) | ORAL | 0 refills | Status: AC
Start: 2019-07-10 — End: 2019-07-20

## 2019-07-10 MED ORDER — IBUPROFEN 600 MG TAB
600 mg | ORAL | Status: AC
Start: 2019-07-10 — End: 2019-07-09
  Administered 2019-07-10: 04:00:00 via ORAL

## 2019-07-10 MED FILL — MAPAP EXTRA STRENGTH 500 MG TABLET: 500 mg | ORAL | Qty: 2

## 2019-07-10 MED FILL — IBUPROFEN 600 MG TAB: 600 mg | ORAL | Qty: 1

## 2019-07-10 NOTE — ED Notes (Signed)
 I have reviewed discharge instructions with the patient. The patient verbalized understanding. Patient armband removed and shredded. VSS. Patient verbalized understanding of how to properly take any prescribed medications. Patient in NAD upon leaving ER.

## 2019-07-10 NOTE — ED Notes (Signed)
I have reviewed discharge instructions with the patient. The patient verbalized understanding. Patient armband removed and shredded. VSS. Patient verbalized understanding of how to properly take any prescribed medications. Patient in NAD upon leaving ER.

## 2019-08-15 DIAGNOSIS — I10 Essential (primary) hypertension: Secondary | ICD-10-CM

## 2019-08-15 NOTE — ED Provider Notes (Signed)
Nicole Watson is a 38 y.o. female with history of hypertension who was noted her blood pressures been elevated over the last couple of days.  Patient also feels a slight pressure in her head.  She has been compliant with her medications.  She has no difficulty seeing, swallowing, other headaches, numbness, tingling, weakness, chest pain, shortness of breath, swelling in her extremities.  She has appoint with her doctor tomorrow.  No difficulty urinating    The history is provided by the patient.        Past Medical History:   Diagnosis Date   ??? Hypertension        Past Surgical History:   Procedure Laterality Date   ??? HX CESAREAN SECTION     ??? HX OTHER SURGICAL      tummy tuck         No family history on file.    Social History     Socioeconomic History   ??? Marital status: MARRIED     Spouse name: Not on file   ??? Number of children: Not on file   ??? Years of education: Not on file   ??? Highest education level: Not on file   Occupational History   ??? Not on file   Social Needs   ??? Financial resource strain: Not on file   ??? Food insecurity     Worry: Not on file     Inability: Not on file   ??? Transportation needs     Medical: Not on file     Non-medical: Not on file   Tobacco Use   ??? Smoking status: Never Smoker   ??? Smokeless tobacco: Never Used   Substance and Sexual Activity   ??? Alcohol use: No   ??? Drug use: No   ??? Sexual activity: Not on file   Lifestyle   ??? Physical activity     Days per week: Not on file     Minutes per session: Not on file   ??? Stress: Not on file   Relationships   ??? Social Product manager on phone: Not on file     Gets together: Not on file     Attends religious service: Not on file     Active member of club or organization: Not on file     Attends meetings of clubs or organizations: Not on file     Relationship status: Not on file   ??? Intimate partner violence     Fear of current or ex partner: Not on file     Emotionally abused: Not on file     Physically abused: Not on file     Forced  sexual activity: Not on file   Other Topics Concern   ??? Not on file   Social History Narrative   ??? Not on file         ALLERGIES: Pcn [penicillins]    Review of Systems   Constitutional: Negative for fever.   HENT: Negative for sore throat.    Eyes: Negative for visual disturbance.   Respiratory: Negative for cough and shortness of breath.    Cardiovascular: Negative for chest pain.   Gastrointestinal: Negative for abdominal pain.   Genitourinary: Negative for difficulty urinating.   Musculoskeletal: Negative for gait problem.   Skin: Negative for rash.   Allergic/Immunologic: Negative for immunocompromised state.   Neurological: Negative for syncope.   Psychiatric/Behavioral: Positive for sleep disturbance. The patient is nervous/anxious.  Vitals:    08/15/19 2304   BP: (!) 155/105   Pulse: 75   Resp: 15   Temp: 98.6 ??F (37 ??C)   SpO2: 100%   Weight: 68.9 kg (152 lb)   Height: 5\' 3"  (1.6 m)            Physical Exam  Vitals signs and nursing note reviewed.   Constitutional:       General: She is not in acute distress.     Appearance: She is well-developed. She is not ill-appearing, toxic-appearing or diaphoretic.   HENT:      Head: Normocephalic and atraumatic.      Right Ear: External ear normal.      Left Ear: External ear normal.      Nose: Nose normal.      Mouth/Throat:      Pharynx: Uvula midline.   Eyes:      General: No scleral icterus.     Conjunctiva/sclera: Conjunctivae normal.   Neck:      Musculoskeletal: Neck supple.   Cardiovascular:      Rate and Rhythm: Normal rate and regular rhythm.      Heart sounds: Normal heart sounds.   Pulmonary:      Effort: Pulmonary effort is normal.      Breath sounds: Normal breath sounds.   Abdominal:      Palpations: Abdomen is soft.      Tenderness: There is no abdominal tenderness.   Musculoskeletal:      Right lower leg: No edema.      Left lower leg: No edema.   Skin:     General: Skin is warm and dry.      Capillary Refill: Capillary refill takes less than 2  seconds.   Neurological:      Mental Status: She is alert and oriented to person, place, and time.      Gait: Gait normal.   Psychiatric:         Behavior: Behavior normal.          MDM       Procedures    Vitals:  Patient Vitals for the past 12 hrs:   Temp Pulse Resp BP SpO2   08/15/19 2304 98.6 ??F (37 ??C) 75 15 (!) 155/105 100 %         Medications ordered:   Medications   hydroCHLOROthiazide (HYDRODIURIL) tablet 25 mg (has no administration in time range)   acetaminophen (TYLENOL) tablet 1,000 mg (has no administration in time range)   ibuprofen (MOTRIN) tablet 600 mg (has no administration in time range)         Lab findings:  No results found for this or any previous visit (from the past 12 hour(s)).    EKG interpretation by ED Physician:      X-Ray, CT or other radiology findings or impressions:  No orders to display       Progress notes, Consult notes or additional Procedure notes:   We will add on hydrochlorothiazide pending follow-up with her doctor  Do not feel she requires any testing or other work-up here      I have discussed with patient and/or family/sig other the results, interpretation of any imaging if performed, suspected diagnosis and treatment plan to include instructions regarding the diagnoses listed to which understanding was expressed with all questions answered    Reevaluation of patient:   stable    Disposition:  Diagnosis:   1. Hypertension, accelerated  2. Pressure in head        Disposition: home    Follow-up Information     Follow up With Specialties Details Why Contact Info    Follow-up with your doctor tomorrow as scheduled        [facility] EMERGENCY DEPT Emergency Medicine  If symptoms worsen [address]  [place] [id number]  [phone]            Patient's Medications   Start Taking    HYDROCHLOROTHIAZIDE (HYDRODIURIL) 25 MG TABLET    Take 1 Tab by mouth daily for 30 days.   Continue Taking    ERGOCALCIFEROL (VITAMIN D2) 50,000 UNIT CAPSULE    Take 50,000 Units by mouth.     LOSARTAN (COZAAR) 100 MG TABLET    Take 100 mg by mouth daily.    MULTIVITAMIN (ONE A DAY) TABLET    Take 1 tablet by mouth daily.   These Medications have changed    Modified Medication Previous Medication    ACETAMINOPHEN (TYLENOL EXTRA STRENGTH) 500 MG TABLET acetaminophen (Tylenol Extra Strength) 500 mg tablet       Take 2 Tabs by mouth every six (6) hours as needed for Pain.    Take 2 Tabs by mouth every six (6) hours as needed for Pain.   Stop Taking    No medications on file

## 2019-08-15 NOTE — ED Notes (Signed)
 Patient arrives ambulatory through triage with a c/c of HTN x Saturday. Patient states she took a double dose of her losartan on Monday and Tuesday and it helped reduce her blood pressure. Last BP at home was 157/99, current BP 150/105. Patient has an appointment with PCP at 7am tomorrow. Patient is asymptomatic other than not a headache, but I can feel my blood pressure in my head.

## 2019-08-15 NOTE — ED Provider Notes (Signed)
Nicole Watson is a 38 y.o. female with history of hypertension who was noted her blood pressures been elevated over the last couple of days.  Patient also feels a slight pressure in her head.  She has been compliant with her medications.  She has no difficulty seeing, swallowing, other headaches, numbness, tingling, weakness, chest pain, shortness of breath, swelling in her extremities.  She has appoint with her doctor tomorrow.  No difficulty urinating    The history is provided by the patient.        Past Medical History:   Diagnosis Date   ??? Hypertension        Past Surgical History:   Procedure Laterality Date   ??? HX CESAREAN SECTION     ??? HX OTHER SURGICAL      tummy tuck         No family history on file.    Social History     Socioeconomic History   ??? Marital status: MARRIED     Spouse name: Not on file   ??? Number of children: Not on file   ??? Years of education: Not on file   ??? Highest education level: Not on file   Occupational History   ??? Not on file   Social Needs   ??? Financial resource strain: Not on file   ??? Food insecurity     Worry: Not on file     Inability: Not on file   ??? Transportation needs     Medical: Not on file     Non-medical: Not on file   Tobacco Use   ??? Smoking status: Never Smoker   ??? Smokeless tobacco: Never Used   Substance and Sexual Activity   ??? Alcohol use: No   ??? Drug use: No   ??? Sexual activity: Not on file   Lifestyle   ??? Physical activity     Days per week: Not on file     Minutes per session: Not on file   ??? Stress: Not on file   Relationships   ??? Social Well[employer] ge[employer]connections     Talks on phone: Not on file     Gets together: Not on file     Attends religious service: Not on file     Active member of club or organization: Not on file     Attends meetings of clubs or organizations: Not on file     Relationship status: Not on file   ??? Intimate partner violence     Fear of current or ex partner: Not on file     Emotionally abused: Not on file     Physically abused: Not on file      Forced sexual activity: Not on file   Other Topics Concern   ??? Not on file   Social History Narrative   ??? Not on file         ALLERGIES: Pcn [penicillins]    Review of Systems   Constitutional: Negative for fever.   HENT: Negative for sore throat.    Eyes: Negative for visual disturbance.   Respiratory: Negative for cough and shortness of breath.    Cardiovascular: Negative for chest pain.   Gastrointestinal: Negative for abdominal pain.   Genitourinary: Negative for difficulty urinating.   Musculoskeletal: Negative for gait problem.   Skin: Negative for rash.   Allergic/Immunologic: Negative for immunocompromised state.   Neurological: Negative for syncope.   Psychiatric/Behavioral: Positive for sleep disturbance. The patient is nervous/anxious.  Vitals:    08/15/19 2304   BP: (!) 155/105   Pulse: 75   Resp: 15   Temp: 98.6 ??F (37 ??C)   SpO2: 100%   Weight: 68.9 kg (152 lb)   Height: 5\' 3"  (1.6 m)            Physical Exam  Vitals signs and nursing note reviewed.   Constitutional:       General: She is not in acute distress.     Appearance: She is well-developed. She is not ill-appearing, toxic-appearing or diaphoretic.   HENT:      Head: Normocephalic and atraumatic.      Right Ear: External ear normal.      Left Ear: External ear normal.      Nose: Nose normal.      Mouth/Throat:      Pharynx: Uvula midline.   Eyes:      General: No scleral icterus.     Conjunctiva/sclera: Conjunctivae normal.   Neck:      Musculoskeletal: Neck supple.   Cardiovascular:      Rate and Rhythm: Normal rate and regular rhythm.      Heart sounds: Normal heart sounds.   Pulmonary:      Effort: Pulmonary effort is normal.      Breath sounds: Normal breath sounds.   Abdominal:      Palpations: Abdomen is soft.      Tenderness: There is no abdominal tenderness.   Musculoskeletal:      Right lower leg: No edema.      Left lower leg: No edema.   Skin:     General: Skin is warm and dry.       Capillary Refill: Capillary refill takes less than 2 seconds.   Neurological:      Mental Status: She is alert and oriented to person, place, and time.      Gait: Gait normal.   Psychiatric:         Behavior: Behavior normal.          MDM       Procedures    Vitals:  Patient Vitals for the past 12 hrs:   Temp Pulse Resp BP SpO2   08/15/19 2304 98.6 ??F (37 ??C) 75 15 (!) 155/105 100 %         Medications ordered:   Medications   hydroCHLOROthiazide (HYDRODIURIL) tablet 25 mg (has no administration in time range)   acetaminophen (TYLENOL) tablet 1,000 mg (has no administration in time range)   ibuprofen (MOTRIN) tablet 600 mg (has no administration in time range)         Lab findings:  No results found for this or any previous visit (from the past 12 hour(s)).    EKG interpretation by ED Physician:      X-Ray, CT or other radiology findings or impressions:  No orders to display       Progress notes, Consult notes or additional Procedure notes:   We will add on hydrochlorothiazide pending follow-up with her doctor  Do not feel she requires any testing or other work-up here      I have discussed with patient and/or family/sig other the results, interpretation of any imaging if performed, suspected diagnosis and treatment plan to include instructions regarding the diagnoses listed to which understanding was expressed with all questions answered    Reevaluation of patient:   stable    Disposition:  Diagnosis:   1. Hypertension, accelerated  2. Pressure in head        Disposition: home    Follow-up Information     Follow up With Specialties Details Why Contact Info    Follow-up with your doctor tomorrow as scheduled        [facility] EMERGENCY DEPT Emergency Medicine  If symptoms worsen [place]  [phone]            Patient's Medications   Start Taking    HYDROCHLOROTHIAZIDE (HYDRODIURIL) 25 MG TABLET    Take 1 Tab by mouth daily for 30 days.   Continue Taking     ERGOCALCIFEROL (VITAMIN D2) 50,000 UNIT CAPSULE    Take 50,000 Units by mouth.    LOSARTAN (COZAAR) 100 MG TABLET    Take 100 mg by mouth daily.    MULTIVITAMIN (ONE A DAY) TABLET    Take 1 tablet by mouth daily.   These Medications have changed    Modified Medication Previous Medication    ACETAMINOPHEN (TYLENOL EXTRA STRENGTH) 500 MG TABLET acetaminophen (Tylenol Extra Strength) 500 mg tablet       Take 2 Tabs by mouth every six (6) hours as needed for Pain.    Take 2 Tabs by mouth every six (6) hours as needed for Pain.   Stop Taking    No medications on file

## 2019-08-15 NOTE — ED Triage Notes (Signed)
Patient arrives ambulatory through triage with a c/c of HTN x Saturday. Patient states she took a double dose of her losartan on Monday and Tuesday and it helped reduce her blood pressure. Last BP at home was 157/99, current BP 150/105. Patient has an appointment with PCP at 7am tomorrow. Patient is asymptomatic other than "not a headache, but I can feel my blood pressure in my head".

## 2019-08-16 ENCOUNTER — Inpatient Hospital Stay
Admit: 2019-08-16 | Discharge: 2019-08-16 | Disposition: A | Discharge status: Home / Self Care | Payer: TRICARE (CHAMPUS) | Attending: Emergency Medicine

## 2019-08-16 MED ORDER — ACETAMINOPHEN 500 MG TAB
500 mg | ORAL | Status: AC
Start: 2019-08-16 — End: 2019-08-16
  Administered 2019-08-16: 04:00:00 via ORAL

## 2019-08-16 MED ORDER — IBUPROFEN 600 MG TAB
600 mg | ORAL | Status: AC
Start: 2019-08-16 — End: 2019-08-16
  Administered 2019-08-16: 04:00:00 via ORAL

## 2019-08-16 MED ORDER — HYDROCHLOROTHIAZIDE 25 MG TAB
25 mg | ORAL | Status: AC
Start: 2019-08-16 — End: 2019-08-16
  Administered 2019-08-16: 04:00:00 via ORAL

## 2019-08-16 MED ORDER — ACETAMINOPHEN 500 MG TAB
500 mg | ORAL_TABLET | Freq: Four times a day (QID) | ORAL | 0 refills | Status: AC | PRN
Start: 2019-08-16 — End: ?

## 2019-08-16 MED ORDER — HYDROCHLOROTHIAZIDE 25 MG TAB
25 mg | ORAL_TABLET | Freq: Every day | ORAL | 0 refills | Status: AC
Start: 2019-08-16 — End: 2019-09-14

## 2019-08-16 MED FILL — MAPAP EXTRA STRENGTH 500 MG TABLET: 500 mg | ORAL | Qty: 2

## 2019-08-16 MED FILL — HYDROCHLOROTHIAZIDE 25 MG TAB: 25 mg | ORAL | Qty: 1

## 2019-08-16 MED FILL — IBUPROFEN 600 MG TAB: 600 mg | ORAL | Qty: 1

## 2019-08-16 NOTE — ED Notes (Signed)
Discharge information reviewed with patient, patient verbalized understanding. Paperwork signed, and patient in no distress.

## 2022-07-27 IMAGING — MR MRI BRAIN W/WO CONTRAST
11 series · 48 of 48 positions shown · IV contrast (prohance)
Comparison: None

Images Obtained from [facility]
INDICATION: 41-year-old female with histoplasmosis capsulati, other forms of stomatitis. Patient reports was exposed to a fungus.
TECHNIQUE: Multiplanar, multisequence MRI of the brain was performed without and with intravenous contrast. The patient received an intravenous dose of 15 mL ProHance.

[Series 5: flair_axial fs · axial · 4.0mm · 0.75mm/px · 1 of 30 slices shown]
[im 1/30]
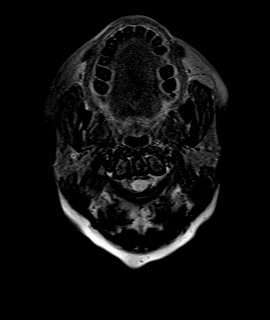

[Series 6: t2_axial · axial · 4.0mm · 0.38mm/px · 1 of 30 slices shown]
[im 1/30]
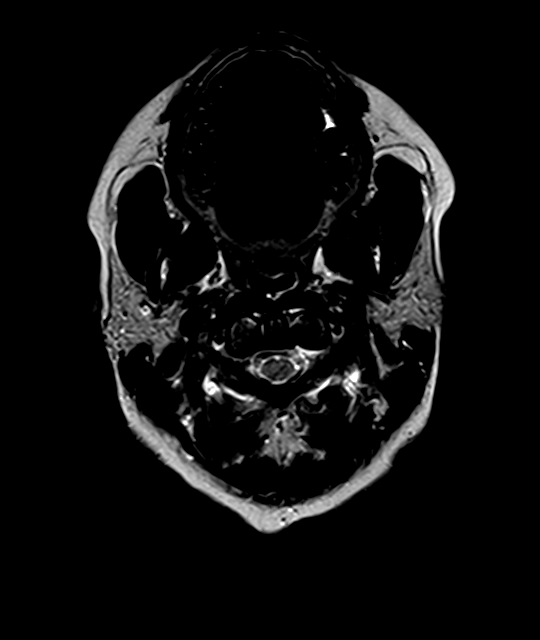

[Series 7: DWI · axial · 4.0mm · 1.36mm/px · 1 of 30 slices shown (1 of 2)]
[im 1/30]
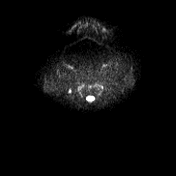

[Series 8: DWI · axial · 4.0mm · 1.36mm/px · 1 of 30 slices shown (2 of 2)]
[im 1/30]
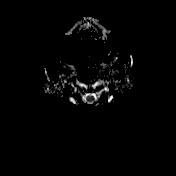

[Series 9: flash_axial · axial · 4.0mm · 0.94mm/px · 1 of 30 slices shown]
[im 1/30]
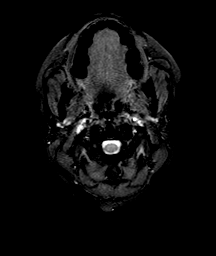

[Series 10: t1_mprage axial · axial · 1.0mm · 0.94mm/px · z∈[-104,+55]mm · 6 of 160 slices shown]
[im 1/160]
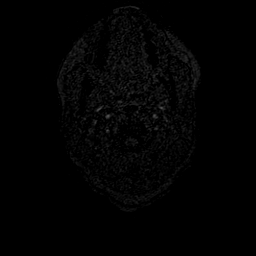
[im 32/160]
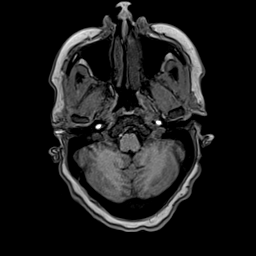
[im 64/160]
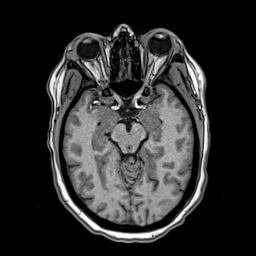
[im 96/160]
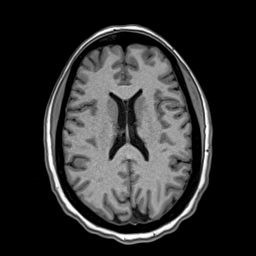
[im 128/160]
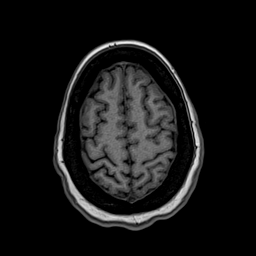
[im 160/160]
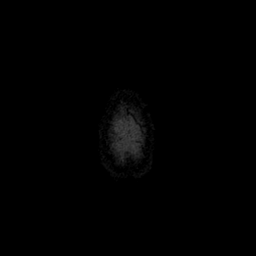

[Series 11: t1_mprage axial_mpr_mprage sag · sagittal · 1.0mm · 0.94mm/px · 7 of 159 slices shown]
[im 1/159]
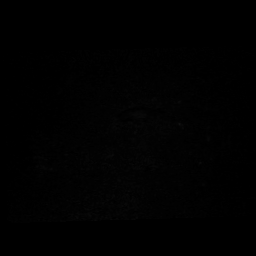
[im 27/159]
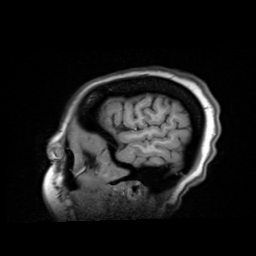
[im 53/159]
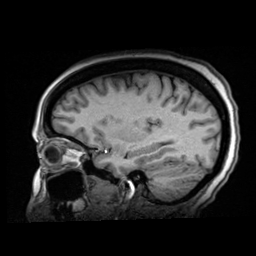
[im 80/159]
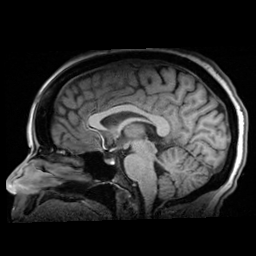
[im 106/159]
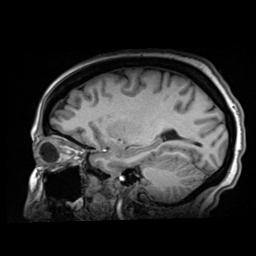
[im 132/159]
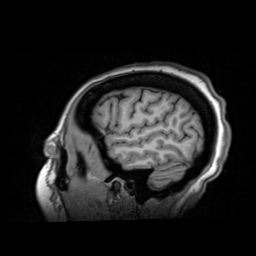
[im 159/159]
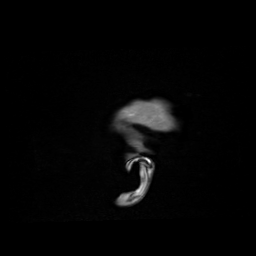

[Series 12: t1_mprage axial_mpr_mprage cor · coronal · 1.0mm · 0.94mm/px · 8 of 189 slices shown]
[im 1/189]
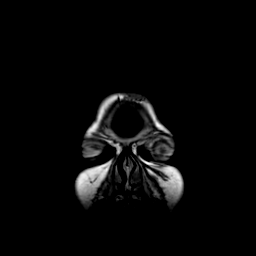
[im 27/189]
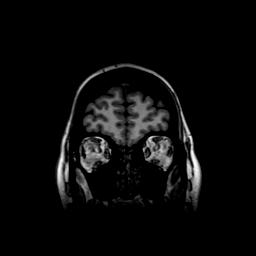
[im 54/189]
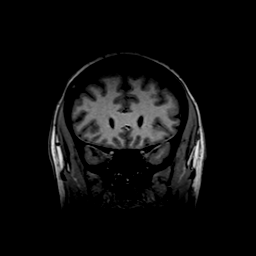
[im 81/189]
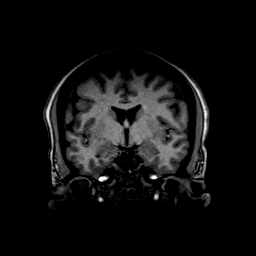
[im 108/189]
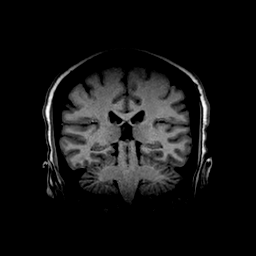
[im 135/189]
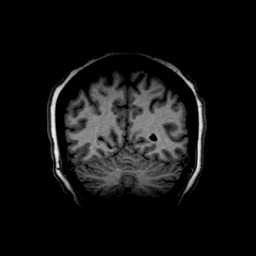
[im 162/189]
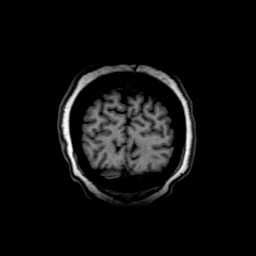
[im 189/189]
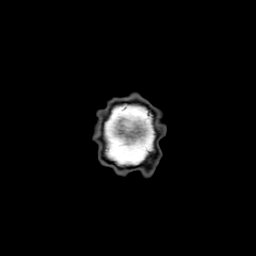

[Series 13: t1_mprage axial+c · axial · 1.0mm · 0.94mm/px · z∈[-104,+55]mm · 7 of 160 slices shown]
[im 1/160]
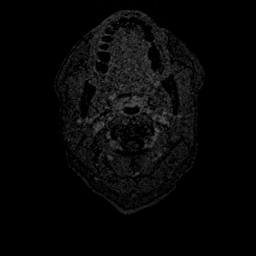
[im 27/160]
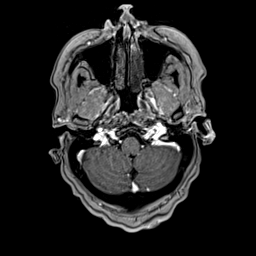
[im 54/160]
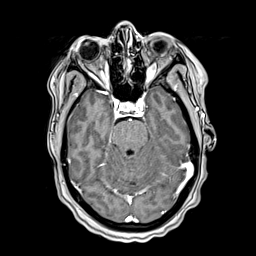
[im 80/160]
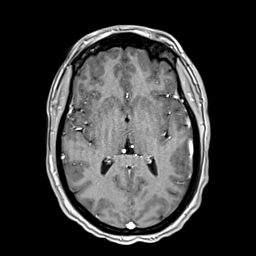
[im 107/160]
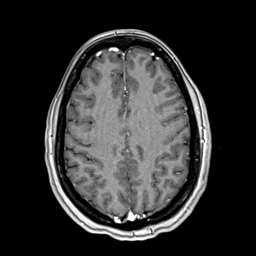
[im 133/160]
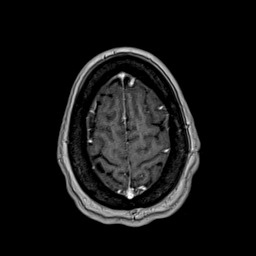
[im 160/160]
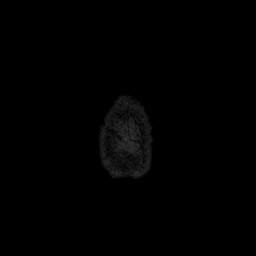

[Series 14: t1_mprage axial+c_mpr_mprage cor · coronal · 1.0mm · 0.94mm/px · 8 of 190 slices shown]
[im 1/190]
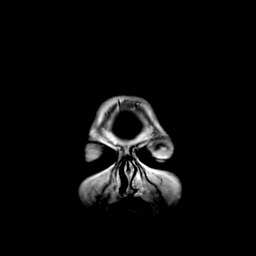
[im 28/190]
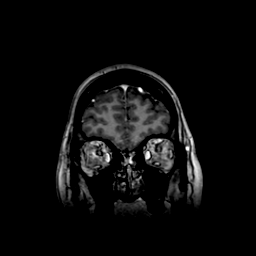
[im 55/190]
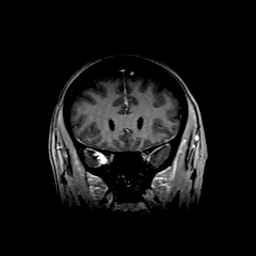
[im 82/190]
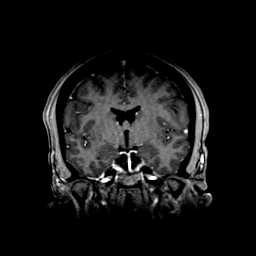
[im 109/190]
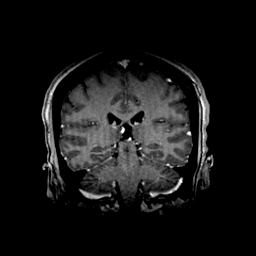
[im 136/190]
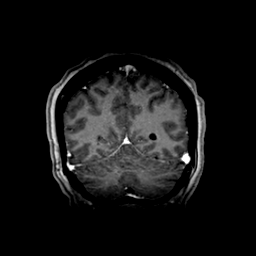
[im 163/190]
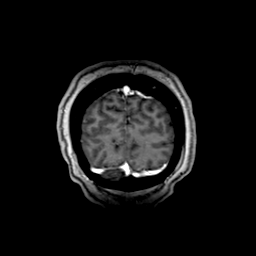
[im 190/190]
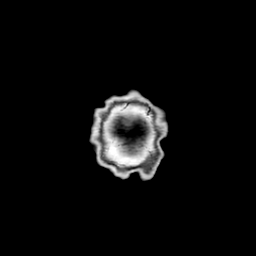

[Series 15: t1_mprage axial+c_mpr_mprage sag · sagittal · 1.0mm · 0.94mm/px · 7 of 160 slices shown]
[im 1/160]
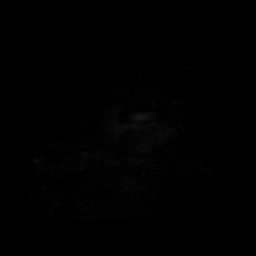
[im 27/160]
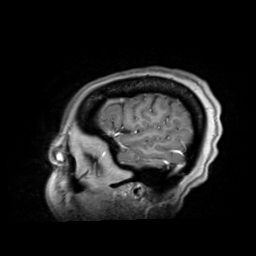
[im 54/160]
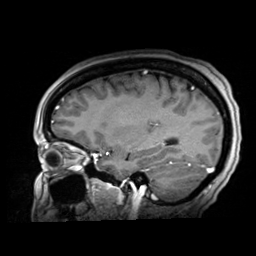
[im 80/160]
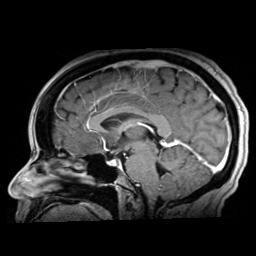
[im 107/160]
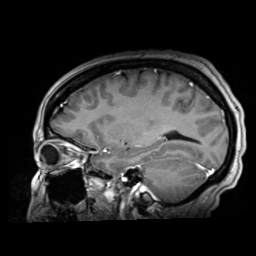
[im 133/160]
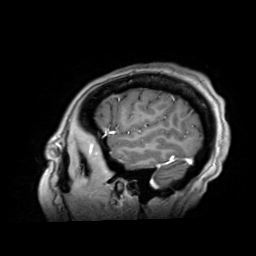
[im 160/160]
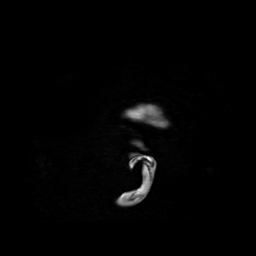

[48 of 48 positions shown; findings below may reference images not displayed]

FINDINGS: Brain parenchyma: No acute infarct or hemorrhage. No parenchymal signal abnormality. Preserved vascular flow voids. T1 hyperintense signal in the posterior pituitary gland, likely an incidental
Rathke's cleft cyst.
Ventricles: No midline shift, herniation or hydrocephalus.
Extra-axial spaces: No extra-axial fluid collection.
Extracranial structures: Paranasal sinuses, mastoid air cells and middle ear cavities without significant disease. Marrow signal normal. Orbits unremarkable. Soft tissues normal.
Contrast enhanced views are negative for pathologic enhancement.
IMPRESSION: Normal MRI of the brain.

## 2022-09-07 IMAGING — MR MRI ABDOMEN WO/W CONTRAST
12 series · 48 of 48 positions shown · IV contrast (prohance)
Comparison: Outside CT angiography chest exam 01/27/2022.

Images Obtained from [facility]
HISTORY: Disorder of adrenal gland.
TECHNIQUE: Multiplanar, multisequence MRI of the abdomen without and with contrast was performed.
Contrast dose: 20 mL ProHance

[Series 2: t2_haste_cor · coronal · 5.0mm · 1.37mm/px · 1 of 32 slices shown]
[im 1/32]
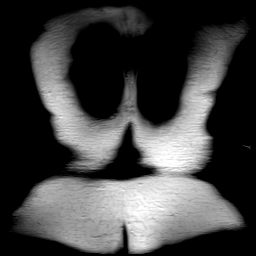

[Series 3: t2_(person_name)_(person_name) · axial · 4.0mm · 1.19mm/px · 1 of 32 slices shown]
[im 1/32]
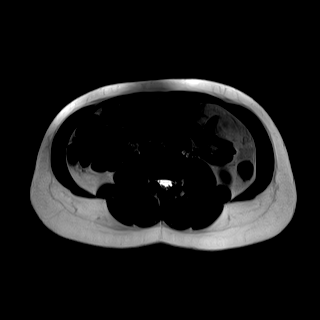

[Series 4: t1_vibe_(person_name)_pre_opp · axial · 3.0mm · 1.48mm/px · z∈[-112,+101]mm · 4 of 72 slices shown]
[im 1/72]
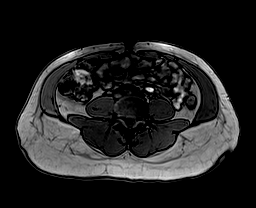
[im 24/72]
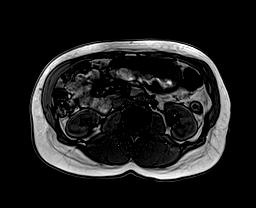
[im 48/72]
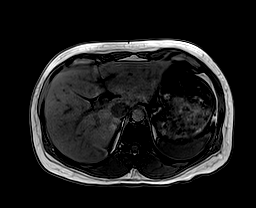
[im 72/72]
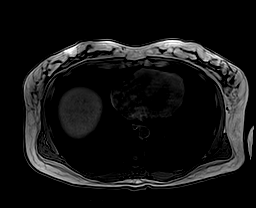

[Series 5: t1_vibe_(person_name)_pre_in · axial · 3.0mm · 1.48mm/px · z∈[-112,+101]mm · 5 of 72 slices shown]
[im 1/72]
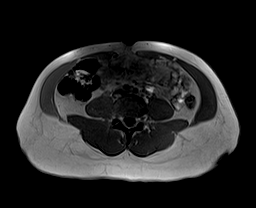
[im 18/72]
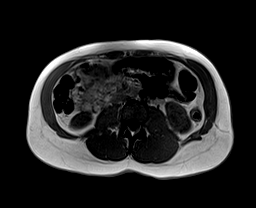
[im 36/72]
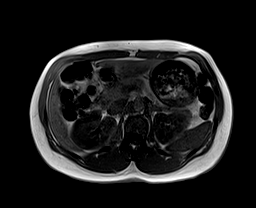
[im 54/72]
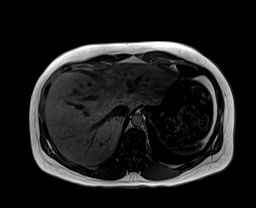
[im 72/72]
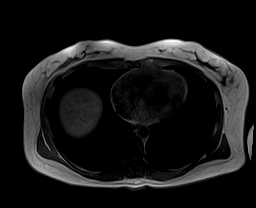

[Series 7: t1_vibe_(person_name)_pre_w · axial · 3.0mm · 1.48mm/px · z∈[-112,+101]mm · 5 of 72 slices shown]
[im 1/72]
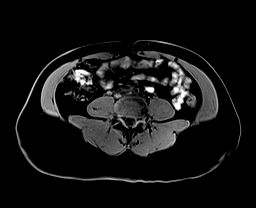
[im 18/72]
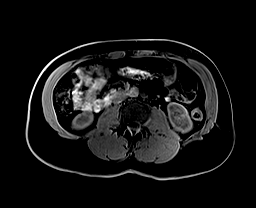
[im 36/72]
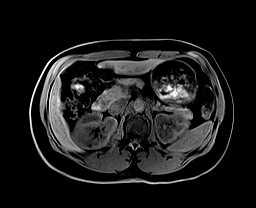
[im 54/72]
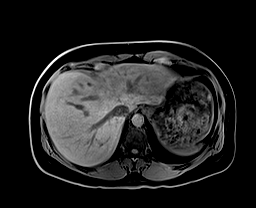
[im 72/72]
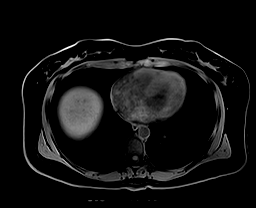

[Series 14: t1_vibe_(person_name)_(person_name)+(person_name)1_w · axial · 3.0mm · 1.48mm/px · z∈[-112,+101]mm · 5 of 72 slices shown]
[im 1/72]
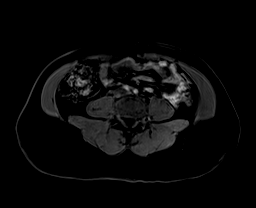
[im 18/72]
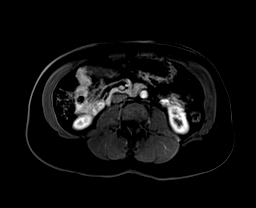
[im 36/72]
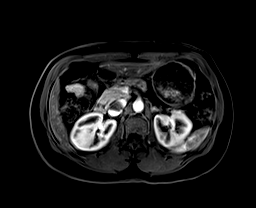
[im 54/72]
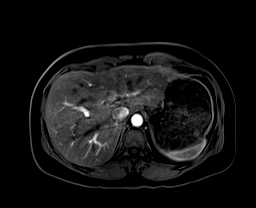
[im 72/72]
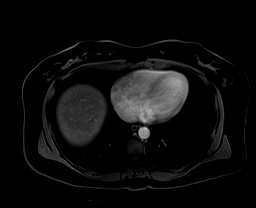

[Series 15: t1_vibe_(person_name)_(person_name)+(person_name)1_w_sub · axial · 3.0mm · 1.48mm/px · z∈[-112,+101]mm · 4 of 71 slices shown]
[im 1/71]
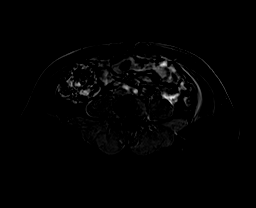
[im 24/71]
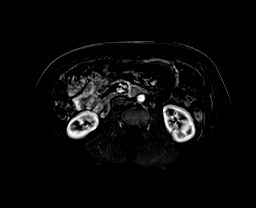
[im 47/71]
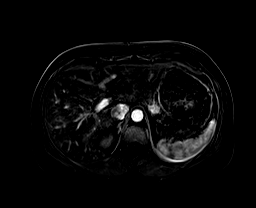
[im 71/71]
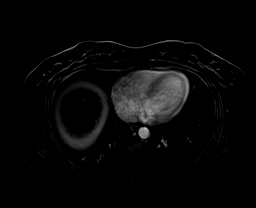

[Series 22: t1_vibe_(person_name)_(person_name)+(person_name)2_w · axial · 3.0mm · 1.48mm/px · z∈[-112,+101]mm · 5 of 72 slices shown]
[im 1/72]
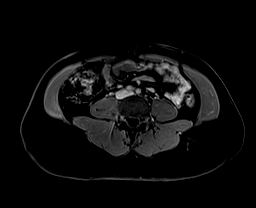
[im 18/72]
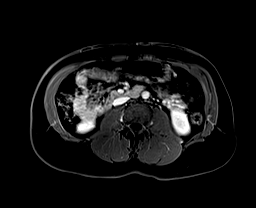
[im 36/72]
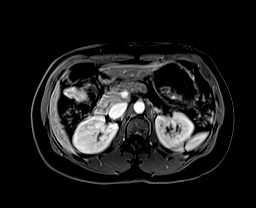
[im 54/72]
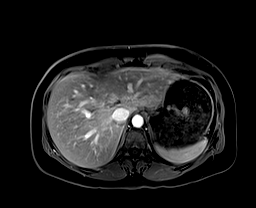
[im 72/72]
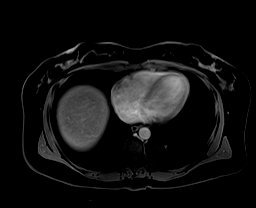

[Series 23: t1_vibe_(person_name)_(person_name)+(person_name)2_w_sub · axial · 3.0mm · 1.48mm/px · z∈[-112,+101]mm · 5 of 72 slices shown]
[im 1/72]
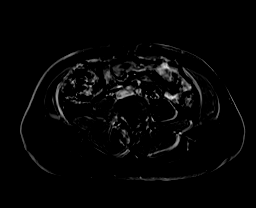
[im 18/72]
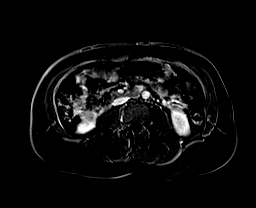
[im 36/72]
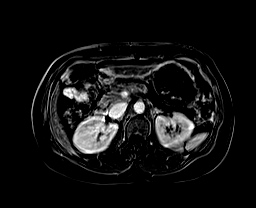
[im 54/72]
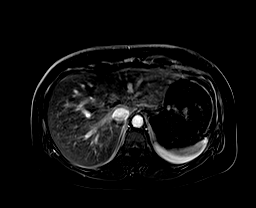
[im 72/72]
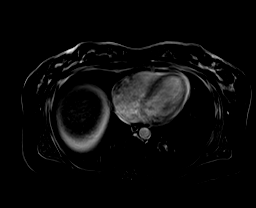

[Series 27: t1_vibe_cor_fs+c_w · coronal · 2.5mm · 1.19mm/px · 3 of 52 slices shown]
[im 1/52]
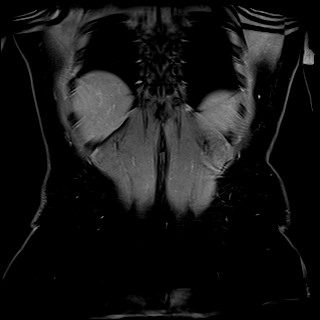
[im 26/52]
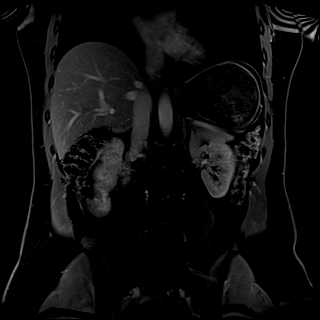
[im 52/52]
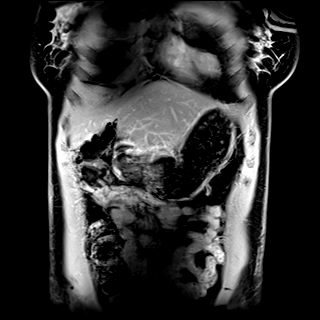

[Series 34: t1_vibe_(person_name)_(person_name)+(person_name)3_w · axial · 3.0mm · 1.48mm/px · z∈[-112,+101]mm · 5 of 72 slices shown]
[im 1/72]
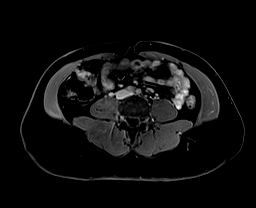
[im 18/72]
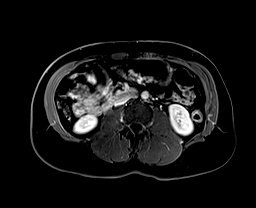
[im 36/72]
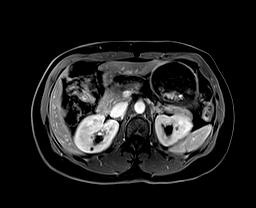
[im 54/72]
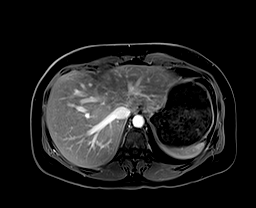
[im 72/72]
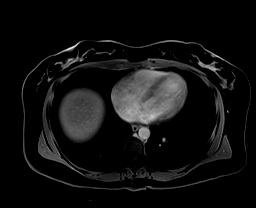

[Series 35: t1_vibe_(person_name)_(person_name)+(person_name)3_w_sub · axial · 3.0mm · 1.48mm/px · z∈[-112,+101]mm · 5 of 72 slices shown]
[im 1/72]
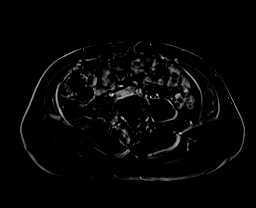
[im 18/72]
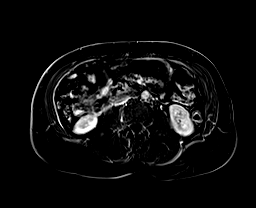
[im 36/72]
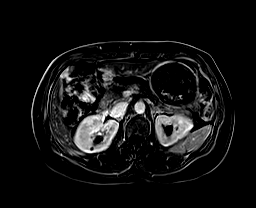
[im 54/72]
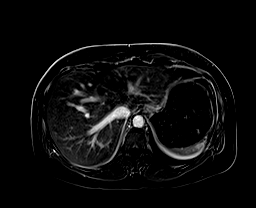
[im 72/72]
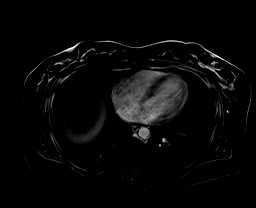

[48 of 48 positions shown; findings below may reference images not displayed]

FINDINGS: On the current exam, both adrenal glands appear essentially normal. Previously seen bilateral adrenal lesions are no longer visualized. Differential considerations would include resolution of adrenal
hyperplasia or resolution of adrenal hemorrhage. Suggest correlation with previous and current clinical findings.
The liver has a homogeneous enhancement without hepatic steatosis. The gallbladder is contracted. There is no biliary ductal dilatation seen. The pancreas, adrenal glands and kidneys are
unremarkable. Visualized bowel loops have a normal caliber. The abdominal aorta is normal in caliber. No adenopathy is seen. There is a very small fat-containing umbilical hernia. No abnormal marrow
signal is seen.
IMPRESSION: 1.  On the current exam, both adrenal glands appear essentially normal. Previously seen bilateral adrenal lesions are no longer visualized. Differential considerations would include resolution of
adrenal hyperplasia/enlargement or resolution of adrenal hemorrhage. Suggest correlation with clinical findings.
2.  Additional comments and findings as above.

## 2023-02-27 IMAGING — US US BREAST LT LTD
1 series · 8 of 8 positions shown · non-contrast
Comparison: 11/30/2022 mammogram

Images Obtained from [facility]
INDICATION: Left breast pain.
TECHNIQUE: Real-time ultrasound of the left breast was performed.

[Series 1: us breast left ltd · 8 of 8 slices shown]
[im 1/8]
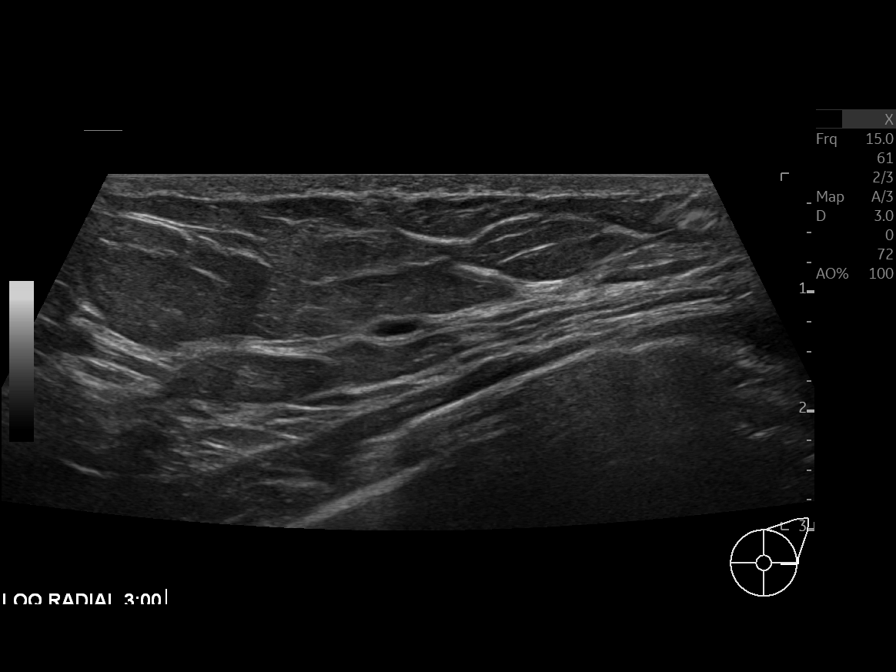
[im 2/8]
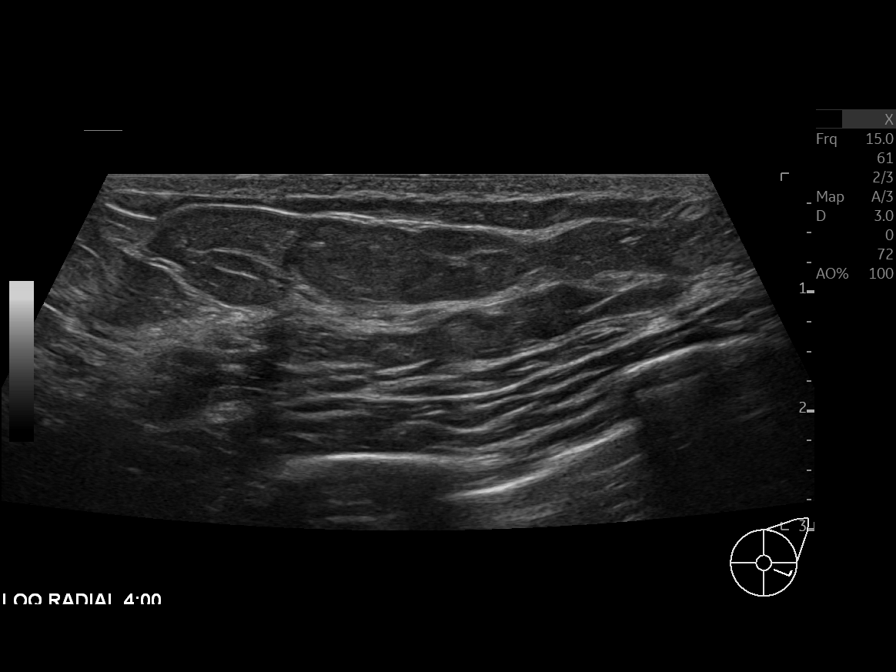
[im 3/8]
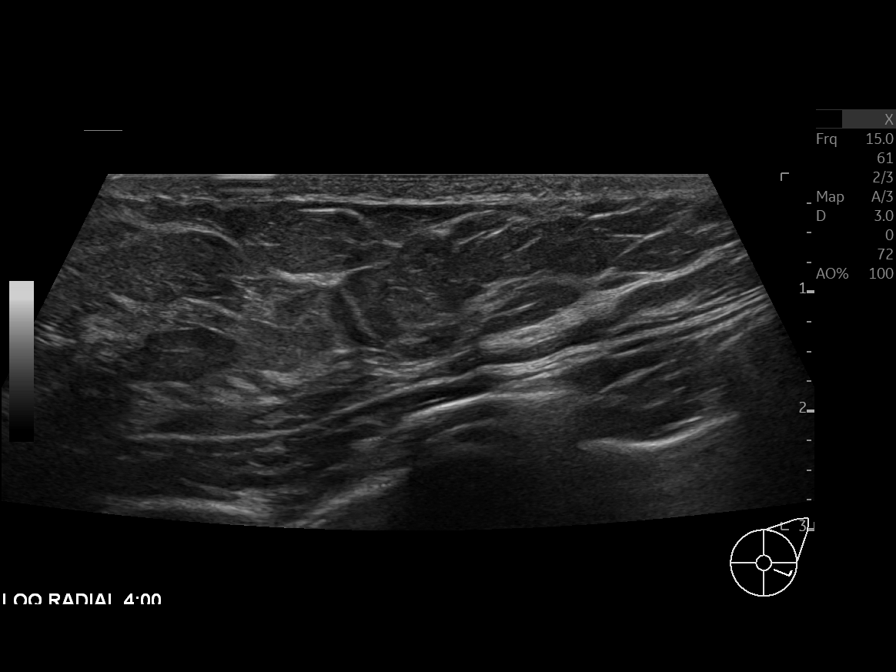
[im 4/8]
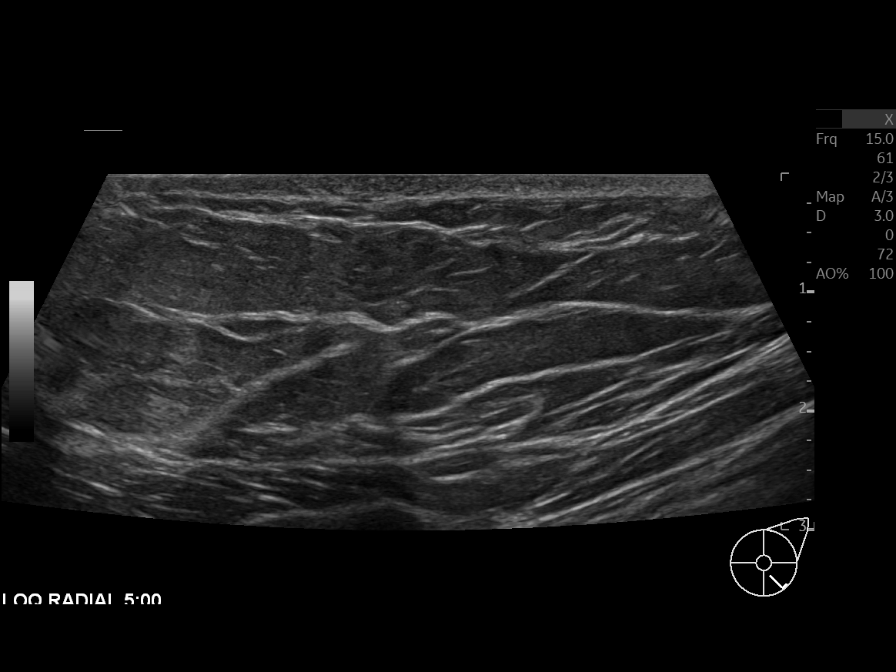
[im 5/8]
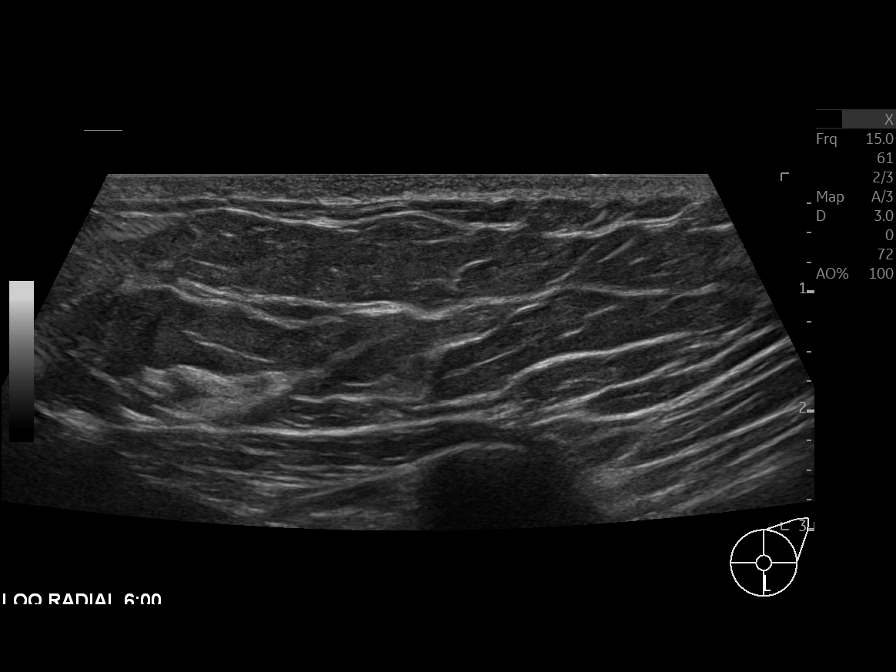
[im 6/8]
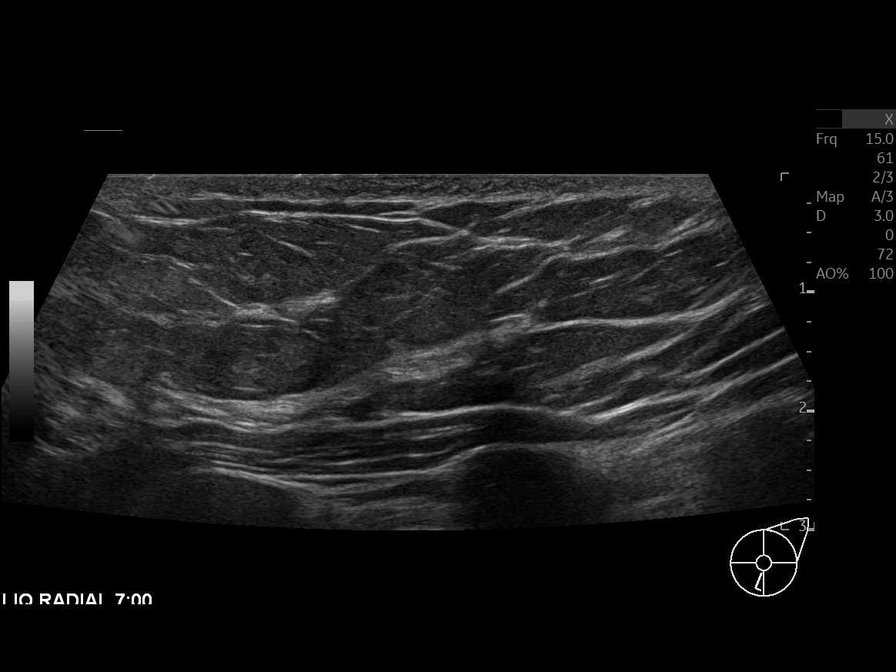
[im 7/8]
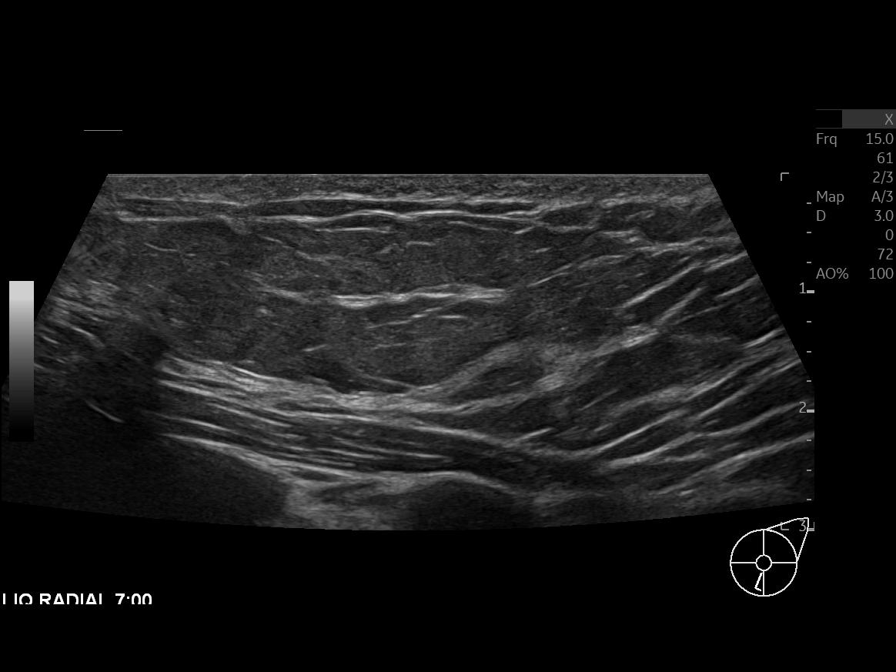
[im 8/8]
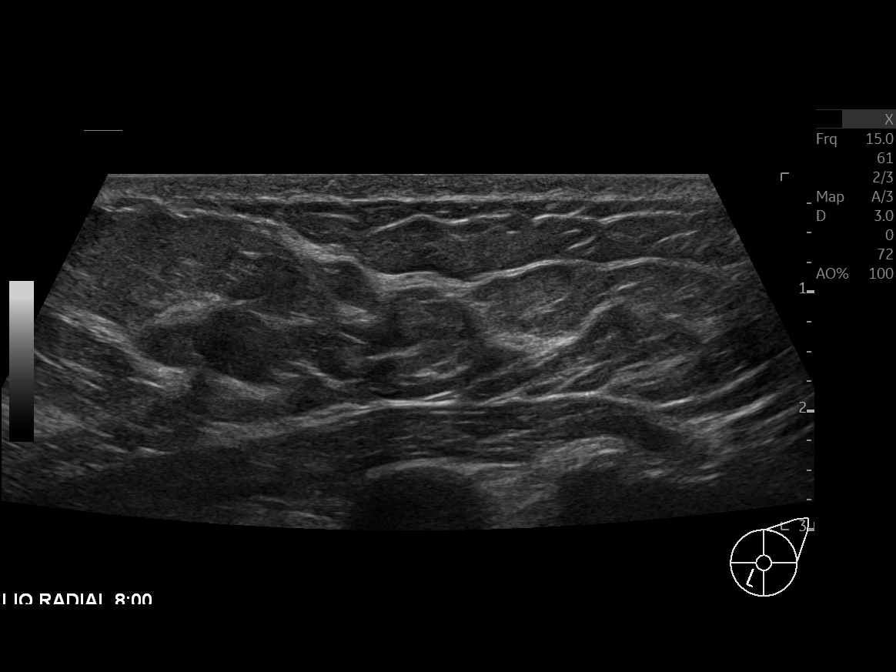

[8 of 8 positions shown; findings below may reference images not displayed]

FINDINGS: No suspicious sonographic abnormality identified in the area of pain as indicated by the patient in the inferior left breast.
IMPRESSION: There is no sonographic evidence of malignancy.  A one year screening mammogram is recommended in November 2023.
The patient received a copy of the results at the end of the examination.
FINAL ASSESSMENT: BI-RADS: Category 1 Negative

## 2023-07-31 IMAGING — CT CT THORAX WO CONTRAST
2 of 5 series · 12 of 36 positions shown, 15 images · IV contrast (agent unspecified)
Comparison: None.

Images Obtained from [facility]
HISTORY: 42 years old Female with shortness of breath, histoplasmosis, unspecified
TECHNIQUE: Axial images of the chest were obtained without intravenous contrast. Axial images were obtained with MPR coronal and sagittal reconstructions. Dose reduction technique used: Automated
exposure control and adjustment of the mA and/or kV according to patient size. Total radiation dose to patient is CTDIvol 3.30 mGy and DLP 110.00 mGy-cm.
. CT/Cardiac Nuclear Medicine exam count in previous 12-months: 0
CONTRAST: None.

[Series 4: coronal · coronal · 0.57mm/px · 3 of 47 slices shown]
[im 10/47  lung]
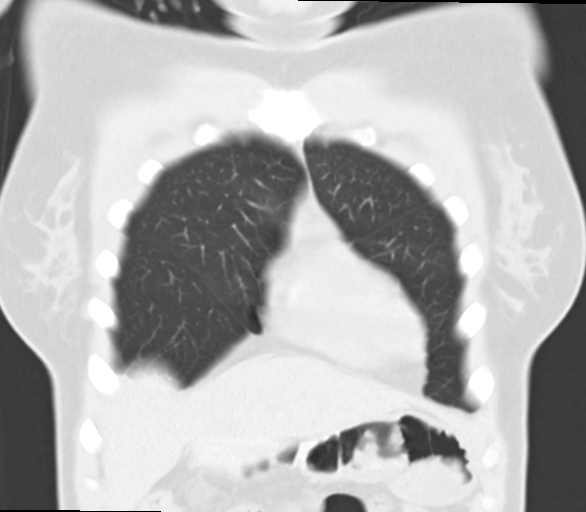
[im 19/47  lung]
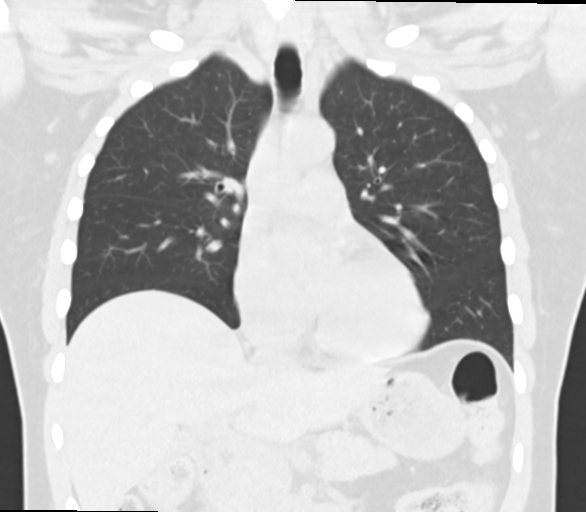
[im 28/47  lung]
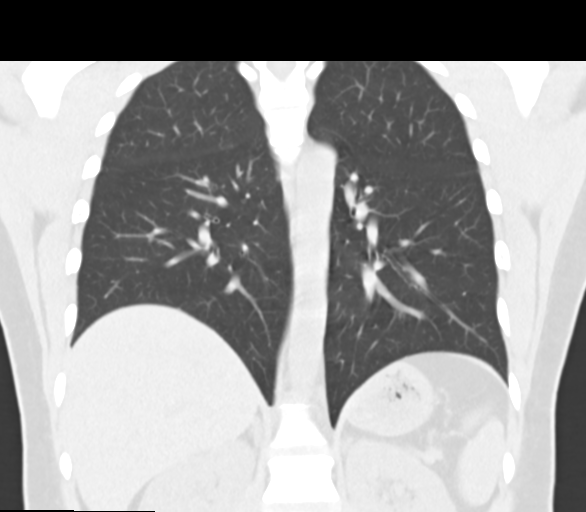

[Series 8: lung · axial · 0.46mm/px · z∈[+1573,+1817]mm · 9 of 146 slices shown, 12 images]
[im 16/146  mediastinal]
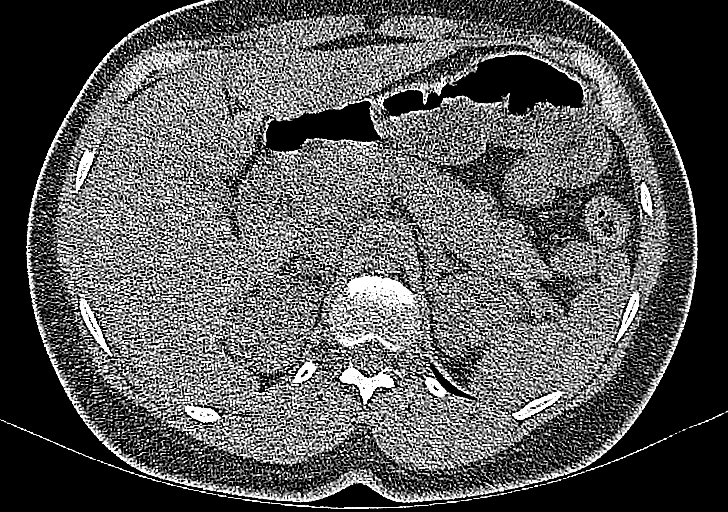
[im 16/146  lung]
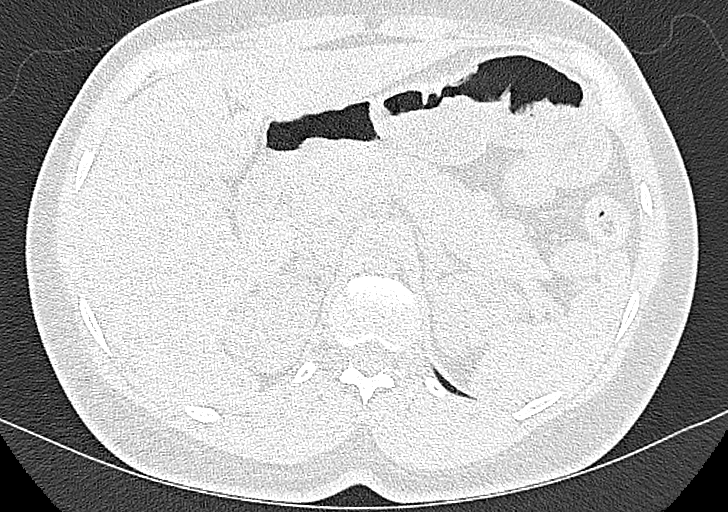
[im 31/146  lung]
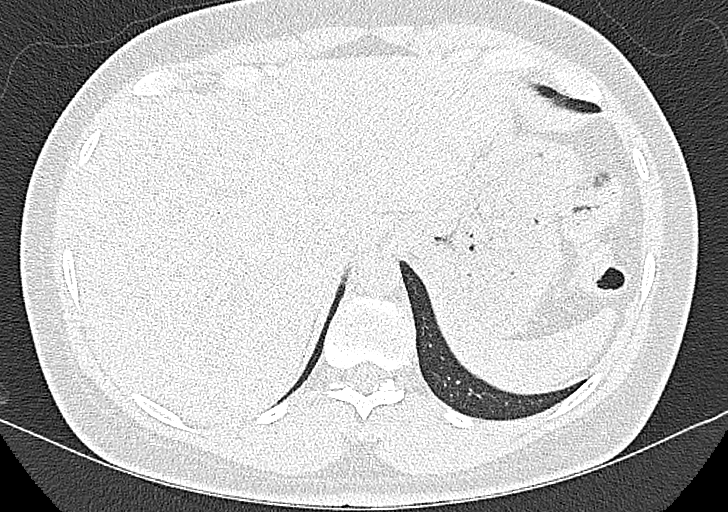
[im 46/146  lung]
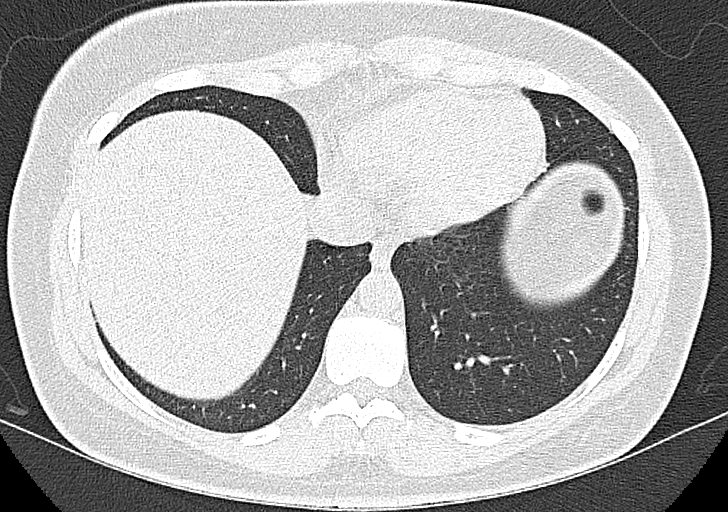
[im 62/146  lung]
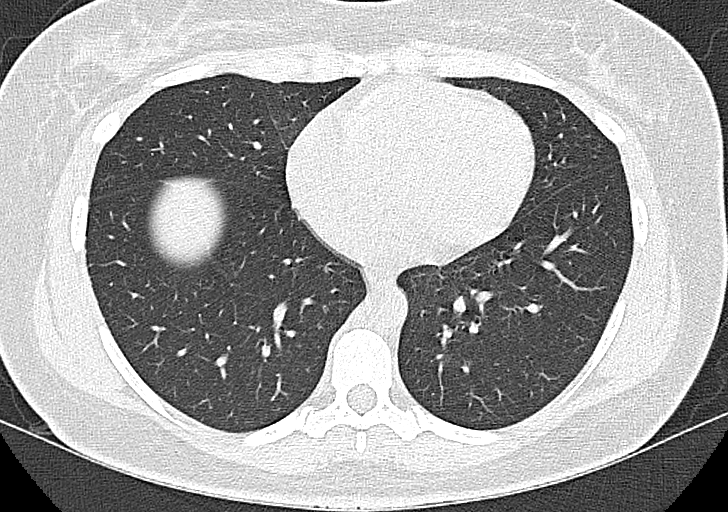
[im 77/146  mediastinal]
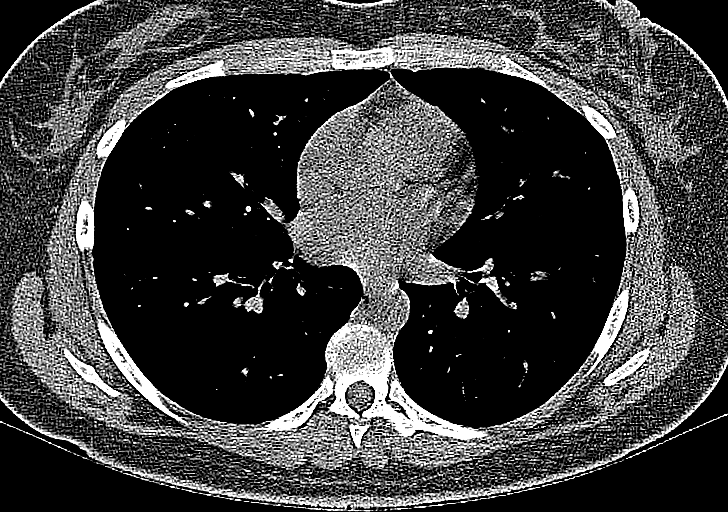
[im 77/146  lung]
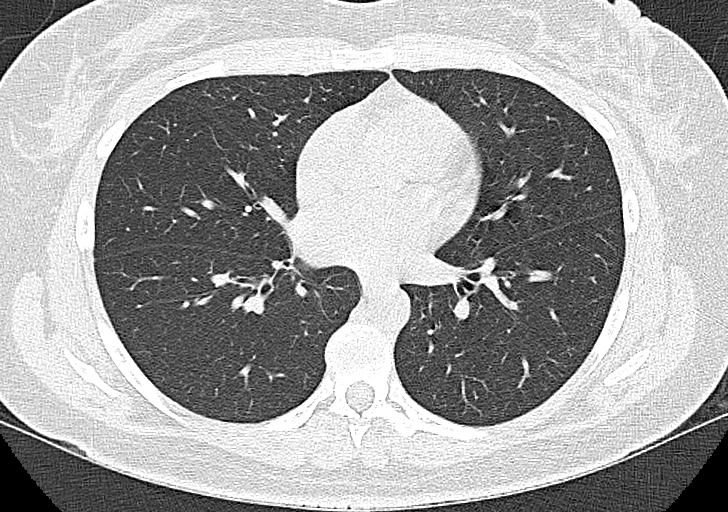
[im 92/146  lung]
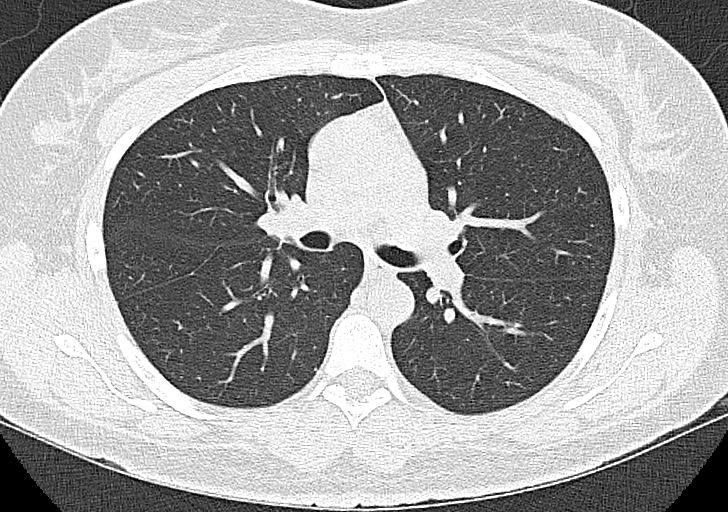
[im 107/146  lung]
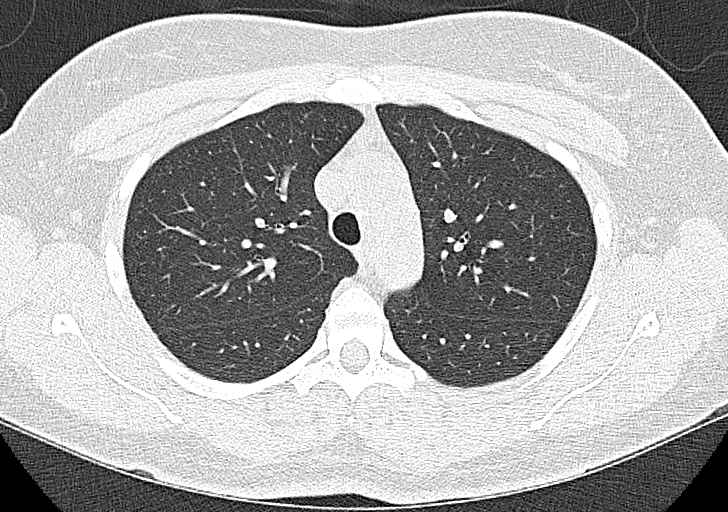
[im 123/146  lung]
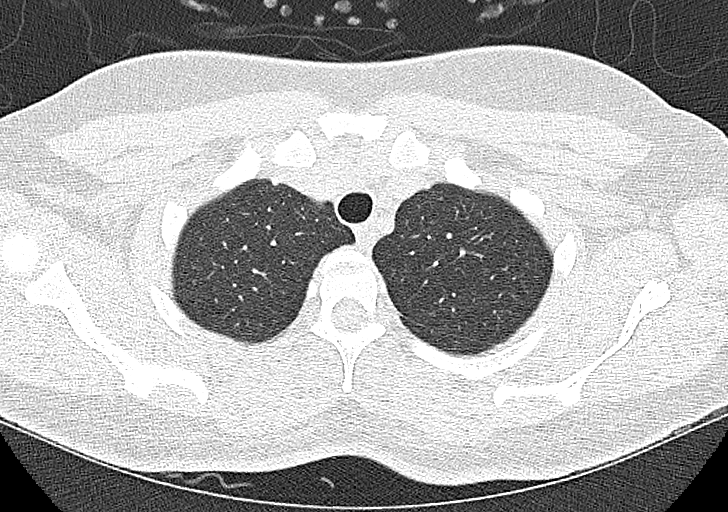
[im 138/146  mediastinal]
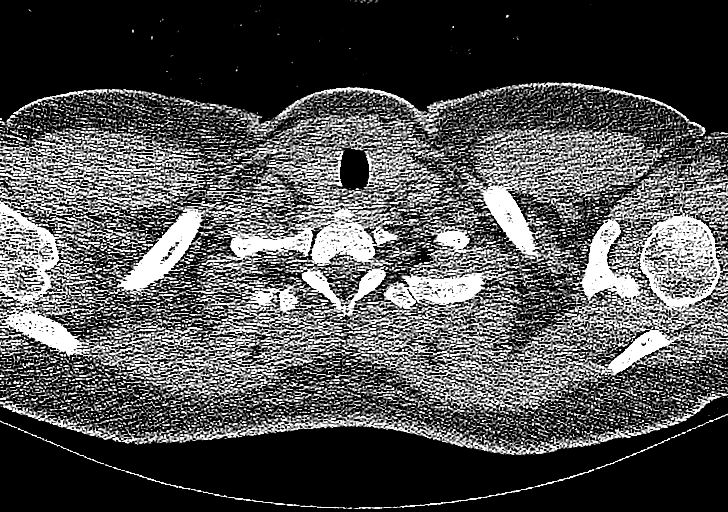
[im 138/146  lung]
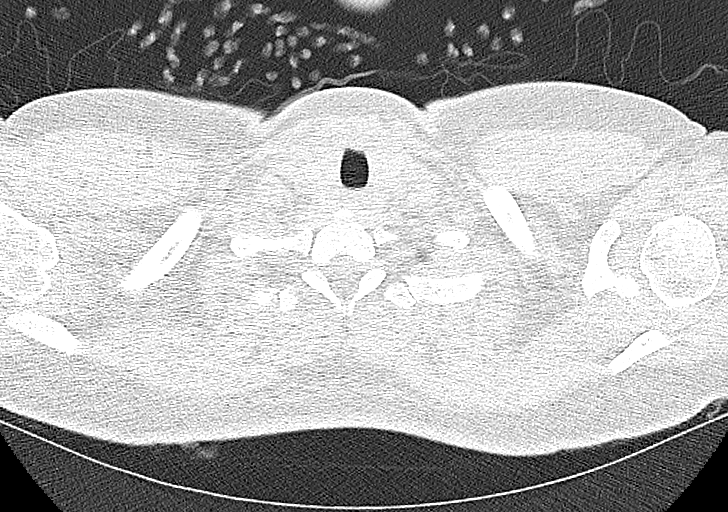

[12 of 36 positions shown; findings below may reference images not displayed]

FINDINGS: LOWER NECK:
*  Unremarkable.
CHEST:
VESSELS:
*  Unremarkable for noncontrast exam.
CORONARY ARTERY CALCIFICATIONS: Absent.
HEART AND PERICARDIUM:
*  The heart is normal in size. No pericardial effusion.
MEDIASTINUM/LYMPH NODES:
*  No significantly enlarged mediastinal or hilar lymph nodes.
LUNGS:
*  No pulmonary consolidations.
AIRWAYS:
*   Major airways are patent.
PLEURA:
*  No pleural effusions or pneumothorax.
UPPER ABDOMEN:
BONES AND SOFT TISSUES:
*  No acute or suspicious osseous abnormalities.
*  No focal soft tissue abnormalities.
IMPRESSION: No acute findings in the chest.

## 2024-06-26 ENCOUNTER — Inpatient Hospital Stay: Admit: 2024-06-26 | Payer: TRICARE (CHAMPUS)

## 2024-06-26 DIAGNOSIS — M25562 Pain in left knee: Principal | ICD-10-CM

## 2024-06-26 NOTE — Progress Notes (Signed)
 PHYSICAL THERAPY - DAILY TREATMENT NOTE    Patient Name: Nicole Watson    Date: 06/26/2024    DOB: 1981/08/10  Insurance: Payor: [employer] / Plan: [employer] PRIME / Product Type: *No Product type* /      Patient DOB verified YES   Visit #   Current / Total 1 16   Time   In / Out 11:40 12:20   Pain   In / Out 3 3   Subjective Functional Status/Changes: SEE EVALUATION     TREATMENT AREA = Pain in left knee  Pain in right knee    OBJECTIVE        22 min   Eval - untimed                      Therapeutic Procedures:  Tx Min Billable or 1:1 Min (if diff from Tx Min) Procedure, Rationale, Specifics   8  97535 Self Care/Home Management (timed):  improve patient knowledge and understanding of home injury/symptom/pain management, positioning, and activity modification  to improve patient's ability to progress to PLOF and address remaining functional goals.  (see flow sheet as applicable)     Details if applicable:       10  97110 Therapeutic Exercise (timed):  increase ROM, strength, coordination, balance, and proprioception to improve patient's ability to progress to PLOF and address remaining functional goals. (see flow sheet as applicable)     Details if applicable:            Details if applicable:     18 40 MC BC Totals Reminder: bill using total billable min of TIMED therapeutic procedures (example: do not include dry needle or estim unattended, both untimed codes, in totals to left)  8-22 min = 1 unit; 23-37 min = 2 units; 38-52 min = 3 units; 53-67 min = 4 units; 68-82 min = 5 units   Total Total     [x]   Patient Education billed concurrently with other procedures   [x]  Review HEP    []  Progressed/Changed HEP, detail:    []  Other detail:       Objective Information/Functional Measures/Assessment    SEE EVALUATION    Patient will benefit from skilled PT services to  modify and progress therapeutic interventions, analyze and address functional mobility deficits, analyze and address ROM deficits, analyze and  address strength deficits, analyze and address soft tissue restrictions, analyze and cue for proper movement patterns, and analyze and modify for postural abnormalities to address functional deficits and attain remaining goals.      Progress toward goals / Updated goals:  []   See Progress Note/Recertification    SEE EVAL    Next PN / RC due: 07/27/24  Auth due: requested today    PLAN  YES Continue plan of care  [x]   Upgrade activities as tolerated  []   Discharge due to :  []   Other:    Donnice FORBES [place], PT DPT, OCS, Cert-DN   02/01/7973    7:16 AM    *If an interpreting service was utilized for treatment of this patient, the contents of this document represent the material reviewed with the patient via the interpreter.     Future Appointments   Date Time Provider Department Center   06/26/2024 11:40 AM Osmel Dykstra, Donnice FORBES, PT [facility]

## 2024-06-26 NOTE — Other (Signed)
 [facility] - [facility] PHYSICAL THERAPY  [address], [address], [place], [place] [address] [phone] Fx: [phone]  Plan of Care / Statement of Necessity for Physical Therapy Services     Patient Name: Nicole Watson DOB: [birth year]   Medical   Diagnosis: Pain in left knee  Pain in right knee Treatment Diagnosis: M25.561  RIGHT KNEE PAIN and M25.562  LEFT KNEE PAIN     Onset Date: June 2025     Referral Source: Lansangan, Fredilynn P,* Start of Care [facility]): 06/26/2024   Prior Hospitalization: See medical history Provider #: [id number]   Prior Level of Function: Indep and pain free, mild knee pain with long distance running which needs more rest to recover.   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 Noticed after Early June session, had left>right knee pain. She does stairs at work and it hurt. Says next morning went to ER. Had Xray and noted bone spurs on the left side (pt notes lateral side of knees). Reports got left knee wrap and ibuprofen and that helped. Reports can't run/jog. She is on 30 days light duty. She prefers walking and light jogging, squats w light weight. Reports she likes to look on Youtube for exercises. Hx of left knee pain 2020, with clear xray, right hip ITB issue and right heel issue. She gets up and moves while sitting at work.     Assessment / key information:  Pt presents to [facility] with signs and symptoms congruent with a diagnosis of arthritic type lateral knee pain, left worse than right, with hamstring weakness, pain at full extension. This affects her ability to run/jog, exercise and complete healthy activity.  Patient would benefit from skilled intervention to address deficits, improve quality of life and return patient to maximum level of available prior function.  Not post operative, standard auth procedure    OBJECTIVE  knee:   Gait/Posture: Slight hip IR in stance, noted knee clicking during gait  Squat/Sit-stand: WNL, crepitations noted. Good control of squat to ~75 deg knee flexion.    Knee PROM *noted pain at end range extension bilaterally  Right: 4 - 0 - 140 deg  Left: 6 - 0 - 140 deg 5/10 P! with end range flexion     Quad Function: good  Strength: lbs  Knee Extension: L: 65,  R: 53  Knee Flexion: L: 24, 5/10 lateral knee P!,  R: 20, 5/10 lateral knee P!  Hip abduction: L: 4+/5, R: 4+/5    Palpation: Non-TTP to bilateral knee structures    Varus testing: lateral knee P! L>R    HEP: Access Code: [email or web address]  URL: [email or web address]  Date: 06/26/2024  Prepared by: Donnice North    Exercises  - Supine Quad Set  - 3 x daily - 7 x weekly - 1 sets - 10 reps - 5 hold  - Supine Hamstring Stretch  - 3 x daily - 7 x weekly - 1 sets - 10 reps  - Seated Hamstring Set  - 3 x daily - 7 x weekly - 1 sets - 10 reps - 5 hold    Evaluation Complexity:  History:  MEDIUM  Complexity : 1-2 comorbidities / personal factors will impact the outcome/ POC ; Examination:  MEDIUM Complexity : 3 Standardized tests and measures addressin body structure, function, activity limitation and / or participation in recreation  ;  Presentation:  MEDIUM Complexity : Evolving with changing characteristics  ;Clinical Decision Making: Lower Extremity Functional Scale (LEFS) = 61/80 ; (61 - 80 Minimal Dysfunction) = LOW Complexity  Overall Complexity Rating: LOW   Problem List: pain affecting function, decrease strength, decrease ADL/functional abilities, and decrease activity tolerance   Treatment Plan may include any combination of the following:  97110 Therapeutic Exercise, 97112 Neuromuscular Re-Education, 97140 Manual Therapy, 97530 Therapeutic Activity, 97535 Self Care/Home Management, 97014 Electrical Stim unattended / [phone] [facility]), [phone] Electrical Stim attended, 02883 Gait Training, [phone] Ultrasound, and (Elective Self Pay) Needle  Insertion w/o Injection (1 or 2 muscles), (3+ muscles)  Patient / Family readiness to learn indicated by: asking questions, trying to perform skills, interest, return verbalization , and return demonstration   Persons(s) to be included in education: patient (P)  Barriers to Learning/Limitations: none  Measures taken if barriers to learning present: -  Patient Goal (s): ways to strengthen my legs  Patient Self Reported Health Status: good  Rehabilitation Potential: good    Short Term Goals: To be accomplished in 4 weeks  Pt to report adherence and demonstrate compliance with basic HEP to allow progress between visits  2.  Pt to report pain <4/10 at worst to allow improved function and QOL (8/10 at worst at [facility])  3. Pt to demonstrate pain free full knee extension PROM (bilaterally) to indicate reduction in joint capsule irritation    Long Term Goals: To be accomplished in 8 weeks  Pt to improve LEFS to 70/80 indicating improved function in daily tasks (61/80 at [facility])  2.  Pt to indicate a Global Rating Of Change (GROC) of 4+ indicating moderately better  3. Pt to demonstrate 5 minutes run/jog in the clinic and report 15 minutes in the community towards increased PT engagement.  4. Pt to demonstrate 35 lbs hamstring strength bilaterally without increase in knee pain to incidate improved knee stability and muscle balance    Frequency / Duration: Patient to be seen 2 times per week for 8 weeks    Patient/ Caregiver education and instruction: Diagnosis, prognosis,  self care, activity modification, and exercises  [x]   Plan of care has been reviewed with PTA, prn.    Certification Period: non-Medicare: N/A    Donnice FORBES North, PT DPT, OCS, Cert-DN      06/26/2024       7:17 AM    Payor: TRICARE EAST / Plan: TRICARE EAST PRIME / Product Type: *No Product type* /     No Physician signature required; Signature on POC no longer required for Medicare as of 12/27/23

## 2024-07-01 ENCOUNTER — Inpatient Hospital Stay: Admit: 2024-07-01 | Payer: TRICARE (CHAMPUS)

## 2024-07-01 NOTE — Progress Notes (Signed)
 PHYSICAL / OCCUPATIONAL THERAPY - DAILY TREATMENT NOTE    Patient Name: Nicole Watson    Date: 07/01/2024    DOB: 07/13/1981  Insurance: Payor: TRICARE EAST / Plan: TRICARE EAST PRIME / Product Type: *No Product type* /      Patient DOB verified Yes     Visit #   Current / Total 2 16   Time   In / Out 900 1000   Pain   In / Out 1/10 1/10   Subjective Functional Status/Changes: Patient reports she jumped rope over the weekend and could feel it in her knees.     TREATMENT AREA =  Pain in left knee  Pain in right knee    OBJECTIVE         Therapeutic Procedures:    Tx Min Billable or 1:1 Min (if diff from Tx Min) Procedure, Rationale, Specifics   20  97110 Therapeutic Exercise (timed):  increase ROM, strength, coordination, balance, and proprioception to improve patient's ability to progress to PLOF and address remaining functional goals. (see flow sheet as applicable)     Details if applicable:       10  97112 Neuromuscular Re-Education (timed):  improve balance, coordination, kinesthetic sense, posture, core stability and proprioception to improve patient's ability to develop conscious control of individual muscles and awareness of position of extremities in order to progress to PLOF and address remaining functional goals. (see flow sheet as applicable)     Details if applicable:       97140 Manual Therapy (timed):  decrease pain, increase ROM, increase tissue extensibility, decrease edema, correct positional vertigo, decrease trigger points, and increase postural awareness to improve patient's ability to progress to PLOF and address remaining functional goals.  The manual therapy interventions were performed at a separate and distinct time from the therapeutic activities interventions . (see flow sheet as applicable)     Details if applicable:     8  97530 Therapeutic Activity (timed):  use of dynamic activities replicating functional movements to increase ROM, strength, coordination, balance, and proprioception in  order to improve patient's ability to progress to PLOF and address remaining functional goals.  (see flow sheet as applicable)     Details if applicable:     12  97535 Self Care/Home Management (timed):  improve patient knowledge and understanding of home injury/symptom/pain management, positioning, posture/ergonomics, home safety, activity modification, transfer techniques, and joint protection strategies  to improve patient's ability to progress to PLOF and address remaining functional goals.  (see flow sheet as applicable)     Details if applicable: EDU on joint pain and relation to exercise.  Discussed progression of HEP.  Discussed speaking with care team concerning hormones and the relation to joint pain.   60  MC BC Totals Reminder: bill using total billable min of TIMED therapeutic procedures (example: do not include dry needle or estim unattended, both untimed codes, in totals to left)  8-22 min = 1 unit; 23-37 min = 2 units; 38-52 min = 3 units; 53-67 min = 4 units; 68-82 min = 5 units   Total Total     Charge Capture    [x]   Patient Education billed concurrently with other procedures   [x]  Review HEP    [x]  Progressed/Changed HEP, detail:  SEE CHART  []  Other detail:       Objective Information/Functional Measures/Assessment    Chart reviewed and subjective taken. Pt requires cues and instruction for all drills, form, therapeutic  focus, and carryover. Followed up on HEP plan to ensure compliance.    Patient will continue to benefit from skilled PT / OT services to modify and progress therapeutic interventions, analyze and address functional mobility deficits, analyze and address ROM deficits, analyze and address strength deficits, analyze and address soft tissue restrictions, analyze and cue for proper movement patterns, analyze and modify for postural abnormalities, analyze and address imbalance/dizziness, and instruct in home and community integration to address functional deficits and attain remaining  goals.    Progress toward goals / Updated goals:  []   See Progress Note/Recertification    First Follow Up since IE    Next PN/ RC due 07/27/24  Auth due (visit number/ date) 15V - 09/27/24    PLAN  - Continue Plan of Care    Tylie Golonka, PTA    07/01/2024    10:06 AM  If an interpreting service was utilized for treatment of this patient, the contents of this document represent the material reviewed with the patient via the interpreter.     Future Appointments   Date Time Provider Department Center   07/05/2024  7:00 AM Buck Donnice BRAVO, PT [facility]   07/08/2024  7:40 AM Buck, Donnice BRAVO, PT [facility]   07/12/2024  2:20 PM Melvenia [place], [place]   07/22/2024  9:00 AM Melvenia [place], [place] [facility]   07/24/2024  2:20 PM Buck Donnice BRAVO [place] [facility]   07/29/2024  7:40 AM Buck Donnice BRAVO, PT [facility]   08/01/2024  3:00 PM Buck Donnice BRAVO, PT [facility]   08/05/2024  7:40 AM Buck Donnice BRAVO, PT [facility]   08/08/2024  3:00 PM Buck Donnice BRAVO, PT [facility]   08/12/2024  7:40 AM Buck Donnice BRAVO, PT [place]   08/15/2024  3:00 PM Buck Donnice BRAVO [facility]   08/19/2024  7:40 AM Buck Donnice BRAVO, PT [facility]   08/22/2024  3:00 PM Danc[place]ers, Donnice BRAVO, PT [facility]

## 2024-07-05 ENCOUNTER — Inpatient Hospital Stay: Admit: 2024-07-05 | Payer: TRICARE (CHAMPUS)

## 2024-07-05 NOTE — Progress Notes (Signed)
 PHYSICAL / OCCUPATIONAL THERAPY - DAILY TREATMENT NOTE    Patient Name: Nicole Watson    Date: 07/05/2024    DOB: September 15, 1981  Insurance: Payor: TRICARE EAST / Plan: TRICARE EAST PRIME / Product Type: *No Product type* /      Patient DOB verified Yes     Visit #   Current / Total 3 16   Time   In / Out 7:00 7:40   Pain   In / Out 1/10 1/10   Subjective Functional Status/Changes: Says yesterday had more pain, she used lidocaine patches. This morning feeling better. Did the HEP this morning.      TREATMENT AREA =  Pain in left knee  Pain in right knee    OBJECTIVE  Therapeutic Procedures:    Tx Min Billable or 1:1 Min (if diff from Tx Min) Procedure, Rationale, Specifics   9  97110 Therapeutic Exercise (timed):  increase ROM, strength, coordination, balance, and proprioception to improve patient's ability to progress to PLOF and address remaining functional goals. (see flow sheet as applicable)     Details if applicable:       8  97112 Neuromuscular Re-Education (timed):  improve balance, coordination, kinesthetic sense, posture, core stability and proprioception to improve patient's ability to develop conscious control of individual muscles and awareness of position of extremities in order to progress to PLOF and address remaining functional goals. (see flow sheet as applicable)     Details if applicable:       97140 Manual Therapy (timed):  decrease pain, increase ROM, increase tissue extensibility, decrease edema, correct positional vertigo, decrease trigger points, and increase postural awareness to improve patient's ability to progress to PLOF and address remaining functional goals.  The manual therapy interventions were performed at a separate and distinct time from the therapeutic activities interventions . (see flow sheet as applicable)     Details if applicable:     23  97530 Therapeutic Activity (timed):  use of dynamic activities replicating functional movements to increase ROM, strength, coordination,  balance, and proprioception in order to improve patient's ability to progress to PLOF and address remaining functional goals.  (see flow sheet as applicable)     Details if applicable:       97535 Self Care/Home Management (timed):  improve patient knowledge and understanding of home injury/symptom/pain management, positioning, posture/ergonomics, home safety, activity modification, transfer techniques, and joint protection strategies  to improve patient's ability to progress to PLOF and address remaining functional goals.  (see flow sheet as applicable)     Details if applicable:    40  MC BC Totals Reminder: bill using total billable min of TIMED therapeutic procedures (example: do not include dry needle or estim unattended, both untimed codes, in totals to left)  8-22 min = 1 unit; 23-37 min = 2 units; 38-52 min = 3 units; 53-67 min = 4 units; 68-82 min = 5 units   Total Total     Charge Capture    [x]   Patient Education billed concurrently with other procedures   [x]  Review HEP    []  Progressed/Changed HEP, detail:   []  Other detail:       Objective Information/Functional Measures/Assessment    Started session on LBE followed by active warm-up (walking knee hug, walking QS, walking HS str). Completed circuit of curtsy step up/overs, HSC and lunge. Progress Hip Series to include y-mini band with slow marches, side step then retro tap backs. Will continue to progress.  Patient will continue to benefit from skilled PT / OT services to modify and progress therapeutic interventions, analyze and address functional mobility deficits, analyze and address ROM deficits, analyze and address strength deficits, analyze and address soft tissue restrictions, analyze and cue for proper movement patterns, analyze and modify for postural abnormalities, analyze and address imbalance/dizziness, and instruct in home and community integration to address functional deficits and attain remaining goals.    Progress toward goals /  Updated goals:  []   See Progress Note/Recertification  Next PN/ RC due 07/27/24  Auth due (visit number/ date) 15V, 09/27/24    Short Term Goals: To be accomplished in 4 weeks  Pt to report adherence and demonstrate compliance with basic HEP to allow progress between visits Current: Compliant to most drills (07/05/24)  2.  Pt to report pain <4/10 at worst to allow improved function and QOL (8/10 at worst at [facility])  3. Pt to demonstrate pain free full knee extension PROM (bilaterally) to indicate reduction in joint capsule irritation     Long Term Goals: To be accomplished in 8 weeks  Pt to improve LEFS to 70/80 indicating improved function in daily tasks (61/80 at [facility])  2.  Pt to indicate a Global Rating Of Change (GROC) of 4+ indicating moderately better  3. Pt to demonstrate 5 minutes run/jog in the clinic and report 15 minutes in the community towards increased PT engagement.  4. Pt to demonstrate 35 lbs hamstring strength bilaterally without increase in knee pain to incidate improved knee stability and muscle balance    PLAN  - Continue Plan of Care    Donnice FORBES North, PT    07/05/2024    6:41 AM  If an interpreting service was utilized for treatment of this patient, the contents of this document represent the material reviewed with the patient via the interpreter.     Future Appointments   Date Time Provider Department Center   07/05/2024  7:00 AM North Donnice FORBES, PT [facility]   07/08/2024  7:40 AM North, Donnice FORBES, PT [facility]   07/12/2024  2:20 PM Melvenia [place], [place] [facility]   07/22/2024  9:00 AM Melvenia [place], [place] [facility]   07/24/2024  2:20 PM North Donnice FORBES ALMETA [facility]   07/29/2024  7:40 AM North Donnice FORBES, PT [facility]   08/01/2024  3:00 PM North Donnice FORBES, PT [facility]   08/05/2024  7:40 AM North Donnice FORBES, PT [facility]   08/08/2024  3:00 PM North Donnice FORBES, PT [facility]   08/12/2024  7:40 AM North Donnice FORBES, PT [facility]   08/15/2024  3:00 PM North Donnice FORBES ALMETA  [facility]   08/19/2024  7:40 AM North Donnice FORBES, PT [facility]   08/22/2024  3:00 PM Itati Brocksmith, Donnice FORBES, PT [facility]

## 2024-07-08 ENCOUNTER — Inpatient Hospital Stay: Admit: 2024-07-08 | Payer: TRICARE (CHAMPUS)

## 2024-07-08 NOTE — Progress Notes (Signed)
 PHYSICAL THERAPY - DAILY TREATMENT NOTE    Patient Name: Nicole Watson    Date: 07/08/2024    DOB: 1981/06/17  Insurance: Payor: TRICARE EAST / Plan: [employer]ARE EAST PRIME / Product Type: *No Product type* /      Patient DOB verified Yes     Visit #   Current / Total 4 16   Time   In / Out 7:40 8:20   Pain   In / Out 3/10 1/10   Subjective Functional Status/Changes: Just right lateral knee pain today. No left knee pain right now.      TREATMENT AREA =  Pain in left knee  Pain in right knee    OBJECTIVE  Therapeutic Procedures:    Tx Min Billable or 1:1 Min (if diff from Tx Min) Procedure, Rationale, Specifics   9  97110 Therapeutic Exercise (timed):  increase ROM, strength, coordination, balance, and proprioception to improve patient's ability to progress to PLOF and address remaining functional goals. (see flow sheet as applicable)     Details if applicable:       8  97112 Neuromuscular Re-Education (timed):  improve balance, coordination, kinesthetic sense, posture, core stability and proprioception to improve patient's ability to develop conscious control of individual muscles and awareness of position of extremities in order to progress to PLOF and address remaining functional goals. (see flow sheet as applicable)     Details if applicable:       97140 Manual Therapy (timed):  decrease pain, increase ROM, increase tissue extensibility, decrease edema, correct positional vertigo, decrease trigger points, and increase postural awareness to improve patient's ability to progress to PLOF and address remaining functional goals.  The manual therapy interventions were performed at a separate and distinct time from the therapeutic activities interventions . (see flow sheet as applicable)     Details if applicable:     23  97530 Therapeutic Activity (timed):  use of dynamic activities replicating functional movements to increase ROM, strength, coordination, balance, and proprioception in order to improve patient's ability  to progress to PLOF and address remaining functional goals.  (see flow sheet as applicable)     Details if applicable:       97535 Self Care/Home Management (timed):  improve patient knowledge and understanding of home injury/symptom/pain management, positioning, posture/ergonomics, home safety, activity modification, transfer techniques, and joint protection strategies  to improve patient's ability to progress to PLOF and address remaining functional goals.  (see flow sheet as applicable)     Details if applicable:    40  MC BC Totals Reminder: bill using total billable min of TIMED therapeutic procedures (example: do not include dry needle or estim unattended, both untimed codes, in totals to left)  8-22 min = 1 unit; 23-37 min = 2 units; 38-52 min = 3 units; 53-67 min = 4 units; 68-82 min = 5 units   Total Total     Charge Capture    [x]   Patient Education billed concurrently with other procedures   [x]  Review HEP    []  Progressed/Changed HEP, detail:   []  Other detail:       Objective Information/Functional Measures/Assessment    Started session with dynamic warm-up along with pt ed on the poor correlation of xray findings to pain and function. Pt seemingly receptive. Continued with flowsheet drills for strength and performance. Completed strength circuit with coaching. Coached in DL rack pull with good result going up to 65#. Completed stretches to finish (rogue band  SLR assist/resist for HS stretch and prone rogue band QS). Pt with god response to session.     Patient will continue to benefit from skilled PT services to modify and progress therapeutic interventions, analyze and address functional mobility deficits, analyze and address ROM deficits, analyze and address strength deficits, analyze and address soft tissue restrictions, analyze and cue for proper movement patterns, analyze and modify for postural abnormalities, analyze and address imbalance/dizziness, and instruct in home and community integration  to address functional deficits and attain remaining goals.    Progress toward goals / Updated goals:  []   See Progress Note/Recertification  Next PN/ RC due 07/27/24  Auth due (visit number/ date) 15V, 09/27/24    Short Term Goals: To be accomplished in 4 weeks  Pt to report adherence and demonstrate compliance with basic HEP to allow progress between visits Current: Compliant to most drills (07/08/24)  2.  Pt to report pain <4/10 at worst to allow improved function and QOL (8/10 at worst at [facility])  3. Pt to demonstrate pain free full knee extension PROM (bilaterally) to indicate reduction in joint capsule irritation     Long Term Goals: To be accomplished in 8 weeks  Pt to improve LEFS to 70/80 indicating improved function in daily tasks (61/80 at [facility])  2.  Pt to indicate a Global Rating Of Change (GROC) of 4+ indicating moderately better  3. Pt to demonstrate 5 minutes run/jog in the clinic and report 15 minutes in the community towards increased PT engagement.  4. Pt to demonstrate 35 lbs hamstring strength bilaterally without increase in knee pain to incidate improved knee stability and muscle balance    PLAN  - Continue Plan of Care    Donnice FORBES [place], PT    07/08/2024    6:44 AM  If an interpreting service was utilized for treatment of this patient, the contents of this document represent the material reviewed with the patient via the interpreter.     Future Appointments   Date Time Provider Department Center   07/08/2024  7:40 AM North Donnice FORBES, PT [facility]   07/12/2024  2:20 PM Melvenia [place], [place] [facility]   07/22/2024  9:00 AM Melvenia Plowman, [facility]   07/24/2024  2:20 PM North Donnice FORBES ALMETA [facility]   07/29/2024  7:40 AM North Donnice FORBES, PT [facility]   08/02/2024  8:20 AM North Donnice FORBES, PT [facility]   08/05/2024  7:40 AM North Donnice FORBES, PT [facility]   08/09/2024  8:20 AM North Donnice FORBES, PT [facility]   08/12/2024  9:00 AM Melvenia Plowman HACKNEY [facility]   08/16/2024  8:20 AM  North Donnice FORBES, PT [facility]   08/19/2024  7:40 AM North Donnice FORBES, PT [facility]   08/23/2024  8:20 AM Leotis Isham, Donnice FORBES, PT [facility]

## 2024-07-12 ENCOUNTER — Inpatient Hospital Stay: Admit: 2024-07-12 | Payer: TRICARE (CHAMPUS)

## 2024-07-12 NOTE — Progress Notes (Signed)
 PHYSICAL THERAPY - DAILY TREATMENT NOTE    Patient Name: Nicole Watson    Date: 07/12/2024    DOB: 01/24/1981  Insurance: Payor: TRICARE EAST / Plan: TRICARE EAST PRIME / Product Type: *No Product type* /      Patient DOB verified Yes     Visit #   Current / Total 5 16   Time   In / Out 220 300   Pain   In / Out 1/10 0/10   Subjective Functional Status/Changes: Reports same lateral R sided pain at knee.  Points to space between fibular head and lateral condyle.Says that she loves to roller skate and that she thinks the lateral knee pain is coming from that (always turning left). Says that end of day knee feels tight and she feel pain when she crosses her R over her L.  Says that she puts cream on the knee for pain.     TREATMENT AREA =  Pain in left knee  Pain in right knee    OBJECTIVE  Therapeutic Procedures:    Tx Min Billable or 1:1 Min (if diff from Tx Min) Procedure, Rationale, Specifics   10  97110 Therapeutic Exercise (timed):  increase ROM, strength, coordination, balance, and proprioception to improve patient's ability to progress to PLOF and address remaining functional goals. (see flow sheet as applicable)     Details if applicable:       8  97112 Neuromuscular Re-Education (timed):  improve balance, coordination, kinesthetic sense, posture, core stability and proprioception to improve patient's ability to develop conscious control of individual muscles and awareness of position of extremities in order to progress to PLOF and address remaining functional goals. (see flow sheet as applicable)     Details if applicable:       97140 Manual Therapy (timed):  decrease pain, increase ROM, increase tissue extensibility, decrease edema, correct positional vertigo, decrease trigger points, and increase postural awareness to improve patient's ability to progress to PLOF and address remaining functional goals.  The manual therapy interventions were performed at a separate and distinct time from the therapeutic  activities interventions . (see flow sheet as applicable)     Details if applicable:     22  97530 Therapeutic Activity (timed):  use of dynamic activities replicating functional movements to increase ROM, strength, coordination, balance, and proprioception in order to improve patient's ability to progress to PLOF and address remaining functional goals.  (see flow sheet as applicable)     Details if applicable:       97535 Self Care/Home Management (timed):  improve patient knowledge and understanding of home injury/symptom/pain management, positioning, posture/ergonomics, home safety, activity modification, transfer techniques, and joint protection strategies  to improve patient's ability to progress to PLOF and address remaining functional goals.  (see flow sheet as applicable)     Details if applicable:    40  MC BC Totals Reminder: bill using total billable min of TIMED therapeutic procedures (example: do not include dry needle or estim unattended, both untimed codes, in totals to left)  8-22 min = 1 unit; 23-37 min = 2 units; 38-52 min = 3 units; 53-67 min = 4 units; 68-82 min = 5 units   Total Total     Charge Capture    [x]   Patient Education billed concurrently with other procedures   [x]  Review HEP    []  Progressed/Changed HEP, detail:   []  Other detail:       Objective Information/Functional Measures/Assessment  Started session with warm up (pt noted fatigue post warm up in hips)- continued on with strengthening drills.  Pt noted pain/discomfort with rack pulls despite good form demonstrated. Pain noted with HS curls, but no pain with curtsy step ups.      ADDED: tib raises at wall    Pt reported no pain post session  Continuing to progress as tolerated and per POC.    Patient will continue to benefit from skilled PT services to modify and progress therapeutic interventions, analyze and address functional mobility deficits, analyze and address ROM deficits, analyze and address strength deficits, analyze  and address soft tissue restrictions, analyze and cue for proper movement patterns, analyze and modify for postural abnormalities, analyze and address imbalance/dizziness, and instruct in home and community integration to address functional deficits and attain remaining goals.    Progress toward goals / Updated goals:  []   See Progress Note/Recertification  Next PN/ RC due 07/27/24  Auth due (visit number/ date) 15V, 09/27/24    Short Term Goals: To be accomplished in 4 weeks  Pt to report adherence and demonstrate compliance with basic HEP to allow progress between visits Current: Compliant to most drills (07/08/24)  2.  Pt to report pain <4/10 at worst to allow improved function an/d QOL (8/10 at worst at [facility])  Current: pt reporting less pain today and no pain post session - 07/12/24  3. Pt to demonstrate pain free full knee extension PROM (bilaterally) to indicate reduction in joint capsule irritation     Long Term Goals: To be accomplished in 8 weeks  Pt to improve LEFS to 70/80 indicating improved function in daily tasks (61/80 at [facility])  2.  Pt to indicate a Global Rating Of Change (GROC) of 4+ indicating moderately better  3. Pt to demonstrate 5 minutes run/jog in the clinic and report 15 minutes in the community towards increased PT engagement.  4. Pt to demonstrate 35 lbs hamstring strength bilaterally without increase in knee pain to incidate improved knee stability and muscle balance    PLAN  - Continue Plan of Care    Donie Moulton, PTA    07/12/2024    3:18 PM  If an interpreting service was utilized for treatment of this patient, the contents of this document represent the material reviewed with the patient via the interpreter.     Future Appointments   Date Time Provider Department Center   07/22/2024  9:00 AM Melvenia [place], [facility]   07/24/2024  2:20 PM Buck Donnice FORBES ALMETA [facility]   07/29/2024  7:40 AM Buck Donnice FORBES, PT [facility]   08/02/2024  8:20 AM Buck Donnice FORBES, PT [facility]    08/05/2024  7:40 AM Buck Donnice FORBES, PT [facility]   08/09/2024  8:20 AM Buck Donnice FORBES, PT [facility]   08/12/2024  9:00 AM Melvenia Lionel HACKNEY [facility]   08/16/2024  8:20 AM Buck Donnice FORBES, PT [facility]   08/19/2024  7:40 AM Buck Donnice FORBES, PT [facility]   08/23/2024  8:20 AM Dancigers, Donnice FORBES, PT [facility]

## 2024-07-22 ENCOUNTER — Inpatient Hospital Stay: Admit: 2024-07-22 | Payer: TRICARE (CHAMPUS)

## 2024-07-22 NOTE — Progress Notes (Signed)
 PHYSICAL THERAPY - DAILY TREATMENT NOTE    Patient Name: Nicole Watson    Date: 07/22/2024    DOB: 03-13-1981  Insurance: Payor: TRICARE EAST / Plan: TRICARE EAST PRIME / Product Type: *No Product type* /      Patient DOB verified Yes     Visit #   Current / Total 5 16   Time   In / Out 900 940   Pain   In / Out 0/10 0/10   Subjective Functional Status/Changes: Reports she walked 2 miles with no pain or swelling.  Says that she is feeling much better.  Notes that she is doing RDLs, hip thrust, and squats at home. Says that she has a little residual pain at lateral distal knee on R.     TREATMENT AREA =  Pain in left knee  Pain in right knee    OBJECTIVE  Therapeutic Procedures:    Tx Min Billable or 1:1 Min (if diff from Tx Min) Procedure, Rationale, Specifics   10  97110 Therapeutic Exercise (timed):  increase ROM, strength, coordination, balance, and proprioception to improve patient's ability to progress to PLOF and address remaining functional goals. (see flow sheet as applicable)     Details if applicable:       8  97112 Neuromuscular Re-Education (timed):  improve balance, coordination, kinesthetic sense, posture, core stability and proprioception to improve patient's ability to develop conscious control of individual muscles and awareness of position of extremities in order to progress to PLOF and address remaining functional goals. (see flow sheet as applicable)     Details if applicable:       97140 Manual Therapy (timed):  decrease pain, increase ROM, increase tissue extensibility, decrease edema, correct positional vertigo, decrease trigger points, and increase postural awareness to improve patient's ability to progress to PLOF and address remaining functional goals.  The manual therapy interventions were performed at a separate and distinct time from the therapeutic activities interventions . (see flow sheet as applicable)     Details if applicable:     22  97530 Therapeutic Activity (timed):  use of  dynamic activities replicating functional movements to increase ROM, strength, coordination, balance, and proprioception in order to improve patient's ability to progress to PLOF and address remaining functional goals.  (see flow sheet as applicable)     Details if applicable:       97535 Self Care/Home Management (timed):  improve patient knowledge and understanding of home injury/symptom/pain management, positioning, posture/ergonomics, home safety, activity modification, transfer techniques, and joint protection strategies  to improve patient's ability to progress to PLOF and address remaining functional goals.  (see flow sheet as applicable)     Details if applicable:    40  MC BC Totals Reminder: bill using total billable min of TIMED therapeutic procedures (example: do not include dry needle or estim unattended, both untimed codes, in totals to left)  8-22 min = 1 unit; 23-37 min = 2 units; 38-52 min = 3 units; 53-67 min = 4 units; 68-82 min = 5 units   Total Total     Charge Capture    [x]   Patient Education billed concurrently with other procedures   [x]  Review HEP    []  Progressed/Changed HEP, detail:   []  Other detail:       Objective Information/Functional Measures/Assessment    Good start to session with no fatigue reported post warm-up - pt noted she was ready to go.  Good form demonstrated  with barbell RDL and no pain noted this visit. Pt noted symptoms post QHF stretch at bench however was able to tolerate for 30 seconds.    ADDED: elevated pigeon stretch, couch stretch (QHF), soleus stretch and ITB stretch.    Pt reported no pain post session, but noted she may feel it later.    Continuing to progress as tolerated and per POC.    Patient will continue to benefit from skilled PT services to modify and progress therapeutic interventions, analyze and address functional mobility deficits, analyze and address ROM deficits, analyze and address strength deficits, analyze and address soft tissue  restrictions, analyze and cue for proper movement patterns, analyze and modify for postural abnormalities, analyze and address imbalance/dizziness, and instruct in home and community integration to address functional deficits and attain remaining goals.    Progress toward goals / Updated goals:  []   See Progress Note/Recertification  Next PN/ RC due 07/27/24  Auth due (visit number/ date) 15V, 09/27/24    **Goal progress: heavy focus on LE endurance and tolerance to functional strengthening drills - pt showing improved tolerance to all.  -07/22/24    Short Term Goals: To be accomplished in 4 weeks  Pt to report adherence and demonstrate compliance with basic HEP to allow progress between visits Current: Compliant to most drills (07/08/24)  2.  Pt to report pain <4/10 at worst to allow improved function an/d QOL (8/10 at worst at [facility])  Current: pt reporting less pain today and no pain post session - 07/12/24  3. Pt to demonstrate pain free full knee extension PROM (bilaterally) to indicate reduction in joint capsule irritation       Long Term Goals: To be accomplished in 8 weeks  Pt to improve LEFS to 70/80 indicating improved function in daily tasks (61/80 at [facility])  2.  Pt to indicate a Global Rating Of Change (GROC) of 4+ indicating moderately better  3. Pt to demonstrate 5 minutes run/jog in the clinic and report 15 minutes in the community towards increased PT engagement.  4. Pt to demonstrate 35 lbs hamstring strength bilaterally without increase in knee pain to incidate improved knee stability and muscle balance    PLAN  - Continue Plan of Care    Ernest Popowski, PTA    07/22/2024    3:34 PM  If an interpreting service was utilized for treatment of this patient, the contents of this document represent the material reviewed with the patient via the interpreter.     Future Appointments   Date Time Provider Department Center   07/24/2024  2:20 PM Buck Donnice FORBES ALMETA [facility]   07/29/2024  7:40 AM Buck, Donnice FORBES, PT [facility]   08/02/2024  8:20 AM Buck Donnice FORBES, PT [facility]   08/05/2024  7:40 AM Buck Donnice FORBES, PT [facility]   08/09/2024  8:20 AM Buck Donnice FORBES, PT [facility]   08/12/2024  9:00 AM Melvenia Lionel HACKNEY [facility]   08/16/2024  8:20 AM Buck Donnice FORBES, PT [facility]   08/19/2024  7:40 AM Buck Donnice FORBES, PT [facility]   08/23/2024  8:20 AM Dancigers, Donnice FORBES, PT [facility]

## 2024-07-24 NOTE — Telephone Encounter (Signed)
 patient called to advise she needs to cancel due to work schedule

## 2024-07-29 ENCOUNTER — Inpatient Hospital Stay: Payer: TRICARE (CHAMPUS)

## 2024-08-02 ENCOUNTER — Inpatient Hospital Stay: Admit: 2024-08-02 | Payer: TRICARE (CHAMPUS)

## 2024-08-02 NOTE — Other (Addendum)
 [facility] THERAPY  [address], [address] [place], [place] [address] - Ph: [phone]  Fx: [phone]  PHYSICAL THERAPY PROGRESS NOTE  Patient Name: Nicole Watson DOB: 05/16/1981   Treatment/Medical Diagnosis: Pain in left knee  Pain in right knee / M25.561  RIGHT KNEE PAIN and M25.562  LEFT KNEE PAIN     Referral Source: Enolia Jannifer SQUIBB,*     Date of Initial Visit: 06/26/24 Attended Visits: 7 Missed Visits:  3     SUMMARY OF TREATMENT  Pt seen in clinic for to address Pain in right knee [M25.561]  Pain in left knee [M25.562]. Pt has been assessed, completed therapeutic exercises, neuromuscular re-ed, therapeutic functional activity, received manual therapy intervention, self-care strategies, HEP techniques and pt ed on condition consistency and follow-through. -    Subjective: Pt reports 50% of where patient wants to be. Pt reports improvements in nothing. Says she can still do everything, nothing stops her, she just goes slow and takes it careful. Pt reports continued difficulties in doing her prior activity as fast. Says the pain comes and goes and is random.   Pt reports after doing knee PT she wants to do left shoulder PT from doing push-ups.   Reports was doing so well no pain this week, reports her knees hurt today. No reason, she did the rowing machine and some exercise yesterday. Says 4/10 annoying pain this morning.    CURRENT STATUS  Pt seen for 6 visits to address bilateral knee pain. Pt with variable symptoms at this time and primary deficits remain subjective. Pt's ROM and movement patterns are improving. Pt gives good effort in sessions focused on mobility and strength acquisition. Pt goals remain appropriate at this time. Will progress her function and physical tolerance over set POC.   Progressed drills to include POGOs (vertical ankle hops, AP hops, ML hops, then lateral single leg hops skaters then AP sl hops fake-outs). Pt with  good effort and perofmance today, no reports of increased pain. This indicates good prognosis for improving running function.     GOALS:  Short Term Goals: To be accomplished in 4 weeks  Pt to report adherence and demonstrate compliance with basic HEP to allow progress between visits Current: Compliant to most drills (08/02/24)  2.  Pt to report pain <4/10 at worst to allow improved function an/d QOL (8/10 at worst at [facility]) Current: 6/10 at worst after walking up and down the stairs (08/02/24)  3. Pt to demonstrate pain free full knee extension PROM (bilaterally) to indicate reduction in joint capsule irritation Current: MET If I stay in this position it will start to hurt (08/02/24)     Long Term Goals: To be accomplished in 8 weeks  Pt to improve LEFS to 70/80 indicating improved function in daily tasks (61/80 at Eval) NT  2.  Pt to indicate a Global Rating Of Change (GROC) of 4+ indicating moderately better NT  3. Pt to demonstrate 5 minutes run/jog in the clinic and report 15 minutes in the community towards increased PT engagement. NT  4. Pt to demonstrate 35 lbs hamstring strength bilaterally without increase in knee pain to incidate improved knee stability and muscle balance (Eval: R: 20 lbs, 5/10 P!, L: 24 lbs 5/10 P!) Current: R: 21 lbs 6/10 P!, L: 27 lbs 4/10 P! (08/02/24)    Certification Period: n/a    Payor: TRICARE EAST / Plan: [employer]ARE [place]AST PRIME / Product Type: *  No Product type* /     Non-Medicare, NO NEW GOALS, can adjust or add frequency duration, no signature required      CONTINUE ALL PREVIOUS UNMET GOALS FROM ABOVE    Frequency / Duration:   Patient to be seen   2   times per week for   4    WEEKS    RECOMMENDATIONS  Patient would benefit from the continuation of skilled rehab interventions for functional progress to achieving above stated clinically significant goals.  Continue per initial Plan of Care.    If you have any questions/comments please contact us  directly.  Thank you for allowing us  to  assist in the care of your patient.    Donnice FORBES North, PT DPT, OCS, Cert-DN      08/02/2024       7:35 AM

## 2024-08-02 NOTE — Progress Notes (Addendum)
 PHYSICAL THERAPY - DAILY TREATMENT NOTE    Patient Name: Nicole Watson    Date: 08/02/2024    DOB: 10/20/1981  Insurance: Payor: TRICARE EAST / Plan: TR[employer]ARE EAST PRIME / Product Type: *No Product type* /      Patient DOB verified Yes     Visit #   Current / Total 7 16   Time   In / Out 8:20 9:00   Pain   In / Out 4/10 0-4/10   Subjective Functional Status/Changes:   Pt reports 50% of where patient wants to be. Pt reports improvements in nothing. Says she can still do everything, nothing stops her, she just goes slow and takes it careful. Pt reports continued difficulties in doing her prior activity as fast. Says the pain comes and goes and is random.   Pt reports after doing knee PT she wants to do left shoulder PT from doing push-ups.   Reports was doing so well no pain this week, reports her knees hurt today. No reason, she did the rowing machine and some exercise yesterday. Says 4/10 annoying pain this morning.       TREATMENT AREA =  Pain in left knee  Pain in right knee    OBJECTIVE  Therapeutic Procedures:    Tx Min Billable or 1:1 Min (if diff from Tx Min) Procedure, Rationale, Specifics   9  97110 Therapeutic Exercise (timed):  increase ROM, strength, coordination, balance, and proprioception to improve patient's ability to progress to PLOF and address remaining functional goals. (see flow sheet as applicable)     Details if applicable:       8  97112 Neuromuscular Re-Education (timed):  improve balance, coordination, kinesthetic sense, posture, core stability and proprioception to improve patient's ability to develop conscious control of individual muscles and awareness of position of extremities in order to progress to PLOF and address remaining functional goals. (see flow sheet as applicable)     Details if applicable:       97140 Manual Therapy (timed):  decrease pain, increase ROM, increase tissue extensibility, decrease edema, correct positional vertigo, decrease trigger points, and increase postural  awareness to improve patient's ability to progress to PLOF and address remaining functional goals.  The manual therapy interventions were performed at a separate and distinct time from the therapeutic activities interventions . (see flow sheet as applicable)     Details if applicable:     23  97530 Therapeutic Activity (timed):  use of dynamic activities replicating functional movements to increase ROM, strength, coordination, balance, and proprioception in order to improve patient's ability to progress to PLOF and address remaining functional goals.  (see flow sheet as applicable)     Details if applicable:       97535 Self Care/Home Management (timed):  improve patient knowledge and understanding of home injury/symptom/pain management, positioning, posture/ergonomics, home safety, activity modification, transfer techniques, and joint protection strategies  to improve patient's ability to progress to PLOF and address remaining functional goals.  (see flow sheet as applicable)     Details if applicable:    40  MC BC Totals Reminder: bill using total billable min of TIMED therapeutic procedures (example: do not include dry needle or estim unattended, both untimed codes, in totals to left)  8-22 min = 1 unit; 23-37 min = 2 units; 38-52 min = 3 units; 53-67 min = 4 units; 68-82 min = 5 units   Total Total     Charge Capture    [  x]  Patient Education billed concurrently with other procedures   [x]  Review HEP    []  Progressed/Changed HEP, detail:   []  Other detail:       Objective Information/Functional Measures/Assessment    Pt seen for 6 visits to address bilateral knee pain. Pt with variable symptoms at this time and primary deficits remain subjective. Pt's ROM and movement patterns are improving. Pt gives good effort in sessions focused on mobility and strength acquisition. Pt goals remain appropriate at this time. Will progress her function and physical tolerance over set POC.   Progressed drills to include POGOs  (vertical ankle hops, AP hops, ML hops, then lateral single leg hops skaters then AP sl hops fake-outs). Pt with good effort and perofmance today, no reports of increased pain. This indicates good prognosis for improving running function.     Patient will continue to benefit from skilled PT services to modify and progress therapeutic interventions, analyze and address functional mobility deficits, analyze and address ROM deficits, analyze and address strength deficits, analyze and address soft tissue restrictions, analyze and cue for proper movement patterns, analyze and modify for postural abnormalities, analyze and address imbalance/dizziness, and instruct in home and community integration to address functional deficits and attain remaining goals.    Progress toward goals / Updated goals:  []   See Progress Note/Recertification  Next PN/ RC due 09/02/24  Auth due (visit number/ date) 15V, 09/27/24    Short Term Goals: To be accomplished in 4 weeks  Pt to report adherence and demonstrate compliance with basic HEP to allow progress between visits Current: Compliant to most drills (08/02/24)  2.  Pt to report pain <4/10 at worst to allow improved function an/d QOL (8/10 at worst at [facility]) Current: 6/10 at worst after walking up and down the stairs (08/02/24)  3. Pt to demonstrate pain free full knee extension PROM (bilaterally) to indicate reduction in joint capsule irritation Current: MET If I stay in this position it will start to hurt (08/02/24)    Long Term Goals: To be accomplished in 8 weeks  Pt to improve LEFS to 70/80 indicating improved function in daily tasks (61/80 at Eval) NT  2.  Pt to indicate a Global Rating Of Change (GROC) of 4+ indicating moderately better NT  3. Pt to demonstrate 5 minutes run/jog in the clinic and report 15 minutes in the community towards increased PT engagement. NT  4. Pt to demonstrate 35 lbs hamstring strength bilaterally without increase in knee pain to incidate improved knee  stability and muscle balance (Eval: R: 20 lbs, 5/10 P!, L: 24 lbs 5/10 P!) Current: R: 21 lbs 6/10 P!, L: 27 lbs 4/10 P! (08/02/24)    PLAN  - Continue Plan of Care    Donnice FORBES North, PT    08/02/2024    7:35 AM  If an interpreting service was utilized for treatment of this patient, the contents of this document represent the material reviewed with the patient via the interpreter.     Future Appointments   Date Time Provider Department Center   08/02/2024  8:20 AM North Donnice FORBES, PT [facility]   08/05/2024  7:40 AM North, Donnice FORBES, PT [facility]   08/09/2024  8:20 AM North Donnice FORBES, PT [facility]   08/12/2024  9:00 AM Melvenia Lionel HACKNEY [facility]   08/16/2024  8:20 AM North Donnice FORBES, PT [facility]   08/19/2024  7:40 AM Wetona Viramontes, Donnice FORBES, PT [facility]  08/23/2024  8:20 AM Briceson Broadwater, Donnice BRAVO, PT [facility]

## 2024-08-05 ENCOUNTER — Inpatient Hospital Stay: Admit: 2024-08-05 | Payer: TRICARE (CHAMPUS)

## 2024-08-05 NOTE — Progress Notes (Addendum)
 PHYSICAL THERAPY - DAILY TREATMENT NOTE    Patient Name: Nicole Watson    Date: 08/05/2024    DOB: 06-30-1981  Insurance: Payor: TRICARE EAST / Plan: TRICARE EAST PRIME / Product Type: *No Product type* /      Patient DOB verified Yes     Visit #   Current / Total 8 16   Time   In / Out 7:40 8:15   Pain   In / Out 6/10 0-4/10   Subjective Functional Status/Changes:   Reports the whole legs are hurting. She didn't do anything. Says the pain is worse, the can feel it in the hips to the ankles. Relays it is an aching. Relays just a little bit of cream on this spot (rubbing the lateral right knee).   Currently her whole elgs ache but the lateral right knee is a 6/10. She will see her doctor, call them today and ask for an appointment.       TREATMENT AREA =  Pain in left knee  Pain in right knee    OBJECTIVE  Therapeutic Procedures:    Tx Min Billable or 1:1 Min (if diff from Tx Min) Procedure, Rationale, Specifics   9  97110 Therapeutic Exercise (timed):  increase ROM, strength, coordination, balance, and proprioception to improve patient's ability to progress to PLOF and address remaining functional goals. (see flow sheet as applicable)     Details if applicable:       8  97112 Neuromuscular Re-Education (timed):  improve balance, coordination, kinesthetic sense, posture, core stability and proprioception to improve patient's ability to develop conscious control of individual muscles and awareness of position of extremities in order to progress to PLOF and address remaining functional goals. (see flow sheet as applicable)     Details if applicable:       97140 Manual Therapy (timed):  decrease pain, increase ROM, increase tissue extensibility, decrease edema, correct positional vertigo, decrease trigger points, and increase postural awareness to improve patient's ability to progress to PLOF and address remaining functional goals.  The manual therapy interventions were performed at a separate and distinct time from  the therapeutic activities interventions . (see flow sheet as applicable)     Details if applicable:     18  97530 Therapeutic Activity (timed):  use of dynamic activities replicating functional movements to increase ROM, strength, coordination, balance, and proprioception in order to improve patient's ability to progress to PLOF and address remaining functional goals.  (see flow sheet as applicable)     Details if applicable:       97535 Self Care/Home Management (timed):  improve patient knowledge and understanding of home injury/symptom/pain management, positioning, posture/ergonomics, home safety, activity modification, transfer techniques, and joint protection strategies  to improve patient's ability to progress to PLOF and address remaining functional goals.  (see flow sheet as applicable)     Details if applicable:    35  MC BC Totals Reminder: bill using total billable min of TIMED therapeutic procedures (example: do not include dry needle or estim unattended, both untimed codes, in totals to left)  8-22 min = 1 unit; 23-37 min = 2 units; 38-52 min = 3 units; 53-67 min = 4 units; 68-82 min = 5 units   Total Total     Charge Capture    [x]   Patient Education billed concurrently with other procedures   [x]  Review HEP    []  Progressed/Changed HEP, detail:   []  Other detail:  Objective Information/Functional Measures/Assessment    Insidious worsening of pain reporting today. Started session with global warm up type movements. (LTR, LTR with long lever/LAQ, SLR assist/resist, prone QS in 1/2 frog, bench pigeon).   Pt without complication in completing session drills, reporting good response to movement therapy. Movement, form and ROM continues to look good.     Patient will continue to benefit from skilled PT services to modify and progress therapeutic interventions, analyze and address functional mobility deficits, analyze and address ROM deficits, analyze and address strength deficits, analyze and address  soft tissue restrictions, analyze and cue for proper movement patterns, analyze and modify for postural abnormalities, analyze and address imbalance/dizziness, and instruct in home and community integration to address functional deficits and attain remaining goals.    Progress toward goals / Updated goals:  []   See Progress Note/Recertification  Next PN/ RC due 09/02/24  Auth due (visit number/ date) 15V, 09/27/24    Short Term Goals: To be accomplished in 4 weeks  Pt to report adherence and demonstrate compliance with basic HEP to allow progress between visits Current: Compliant to most drills (08/05/24)  2.  Pt to report pain <4/10 at worst to allow improved function an/d QOL (8/10 at worst at [facility]) Current: 6/10 at worst after walking up and down the stairs (08/02/24)  3. Pt to demonstrate pain free full knee extension PROM (bilaterally) to indicate reduction in joint capsule irritation Current: MET If I stay in this position it will start to hurt (08/02/24)    Long Term Goals: To be accomplished in 8 weeks  Pt to improve LEFS to 70/80 indicating improved function in daily tasks (61/80 at Eval) NT  2.  Pt to indicate a Global Rating Of Change (GROC) of 4+ indicating moderately better NT  3. Pt to demonstrate 5 minutes run/jog in the clinic and report 15 minutes in the community towards increased PT engagement. NT  4. Pt to demonstrate 35 lbs hamstring strength bilaterally without increase in knee pain to incidate improved knee stability and muscle balance (Eval: R: 20 lbs, 5/10 P!, L: 24 lbs 5/10 P!) Current: R: 21 lbs 6/10 P!, L: 27 lbs 4/10 P! (08/02/24)    PLAN  - Continue Plan of Care    Donnice FORBES North, PT DPT, OCS, Cert-DN   1/88/7974    6:46 AM  If an interpreting service was utilized for treatment of this patient, the contents of this document represent the material reviewed with the patient via the interpreter.     Future Appointments   Date Time Provider Department Center   08/05/2024  7:40 AM North Donnice FORBES, PT [facility]   08/09/2024  8:20 AM North Donnice FORBES, PT [facility]   08/12/2024  9:00 AM Melvenia Lionel HACKNEY [facility]   08/16/2024  8:20 AM North Donnice FORBES, PT [facility]   08/19/2024  7:40 AM North Donnice FORBES, PT [facility]   08/23/2024  8:20 AM Brooksie Ellwanger, Donnice FORBES, PT [facility]

## 2024-08-09 ENCOUNTER — Inpatient Hospital Stay: Admit: 2024-08-09 | Payer: TRICARE (CHAMPUS)

## 2024-08-09 NOTE — Progress Notes (Addendum)
 PHYSICAL THERAPY - DAILY TREATMENT NOTE    Patient Name: Nicole Watson    Date: 08/09/2024    DOB: [birth year]  Insurance: Payor: TRICARE EAST / Plan: TRICARE EAST PRIME / Product Type: *No Product type* /      Patient DOB verified Yes     Visit #   Current / Total 9 16   Time   In / Out 8:20 9:00   Pain   In / Out 3/10 3/10   Subjective Functional Status/Changes:   Pain in the knees. Reports tired.        TREATMENT AREA =  Pain in left knee  Pain in right knee    OBJECTIVE  Therapeutic Procedures:    Tx Min Billable or 1:1 Min (if diff from Tx Min) Procedure, Rationale, Specifics   9 9 97110 Therapeutic Exercise (timed):  increase ROM, strength, coordination, balance, and proprioception to improve patient's ability to progress to PLOF and address remaining functional goals. (see flow sheet as applicable)     Details if applicable:       8 8 97112 Neuromuscular Re-Education (timed):  improve balance, coordination, kinesthetic sense, posture, core stability and proprioception to improve patient's ability to develop conscious control of individual muscles and awareness of position of extremities in order to progress to PLOF and address remaining functional goals. (see flow sheet as applicable)     Details if applicable:       97140 Manual Therapy (timed):  decrease pain, increase ROM, increase tissue extensibility, decrease edema, correct positional vertigo, decrease trigger points, and increase postural awareness to improve patient's ability to progress to PLOF and address remaining functional goals.  The manual therapy interventions were performed at a separate and distinct time from the therapeutic activities interventions . (see flow sheet as applicable)     Details if applicable:     23 13 97530 Therapeutic Activity (timed):  use of dynamic activities replicating functional movements to increase ROM, strength, coordination, balance, and proprioception in order to improve patient's ability to progress to PLOF and  address remaining functional goals.  (see flow sheet as applicable)     Details if applicable:       97535 Self Care/Home Management (timed):  improve patient knowledge and understanding of home injury/symptom/pain management, positioning, posture/ergonomics, home safety, activity modification, transfer techniques, and joint protection strategies  to improve patient's ability to progress to PLOF and address remaining functional goals.  (see flow sheet as applicable)     Details if applicable:    40 30 MC BC Totals Reminder: bill using total billable min of TIMED therapeutic procedures (example: do not include dry needle or estim unattended, both untimed codes, in totals to left)  8-22 min = 1 unit; 23-37 min = 2 units; 38-52 min = 3 units; 53-67 min = 4 units; 68-82 min = 5 units   Total Total     Charge Capture    [x]   Patient Education billed concurrently with other procedures   [x]  Review HEP    []  Progressed/Changed HEP, detail:   []  Other detail:       Objective Information/Functional Measures/Assessment    Took pt through flowsheet drills. Pt fatigued today with exercises. Overall similar presentation to prior. Continue POC.     Patient will continue to benefit from skilled PT services to modify and progress therapeutic interventions, analyze and address functional mobility deficits, analyze and address ROM deficits, analyze and address strength deficits, analyze and address soft  tissue restrictions, analyze and cue for proper movement patterns, analyze and modify for postural abnormalities, analyze and address imbalance/dizziness, and instruct in home and community integration to address functional deficits and attain remaining goals.    Progress toward goals / Updated goals:  []   See Progress Note/Recertification  Next PN/ RC due 09/02/24  Auth due (visit number/ date) 15V, 09/27/24    Short Term Goals: To be accomplished in 4 weeks  Pt to report adherence and demonstrate compliance with basic HEP to allow  progress between visits Current: Compliant to most drills (08/09/24)  2.  Pt to report pain <4/10 at worst to allow improved function an/d QOL (8/10 at worst at [facility]) Current: 6/10 at worst after walking up and down the stairs (08/02/24)  3. Pt to demonstrate pain free full knee extension PROM (bilaterally) to indicate reduction in joint capsule irritation Current: MET If I stay in this position it will start to hurt (08/02/24)    Long Term Goals: To be accomplished in 8 weeks  Pt to improve LEFS to 70/80 indicating improved function in daily tasks (61/80 at Eval) NT  2.  Pt to indicate a Global Rating Of Change (GROC) of 4+ indicating moderately better NT  3. Pt to demonstrate 5 minutes run/jog in the clinic and report 15 minutes in the community towards increased PT engagement. NT  4. Pt to demonstrate 35 lbs hamstring strength bilaterally without increase in knee pain to incidate improved knee stability and muscle balance (Eval: R: 20 lbs, 5/10 P!, L: 24 lbs 5/10 P!) Current: R: 21 lbs 6/10 P!, L: 27 lbs 4/10 P! (08/02/24)    PLAN  - Continue Plan of Care    Donnice FORBES North, PT DPT, OCS, Cert-DN   1/84/7974    6:51 AM  If an interpreting service was utilized for treatment of this patient, the contents of this document represent the material reviewed with the patient via the interpreter.     Future Appointments   Date Time Provider Department Center   08/09/2024  8:20 AM North Donnice FORBES, PT [facility]   08/16/2024  8:20 AM North Donnice FORBES, PT [facility]   08/19/2024  7:40 AM North Donnice FORBES, PT [facility]   08/23/2024  8:20 AM Obie Kallenbach, Donnice FORBES, PT [facility]

## 2024-08-16 ENCOUNTER — Inpatient Hospital Stay: Admit: 2024-08-16 | Payer: TRICARE (CHAMPUS)

## 2024-08-16 NOTE — Progress Notes (Addendum)
 PHYSICAL THERAPY - DAILY TREATMENT NOTE    Patient Name: Nicole Watson    Date: 08/16/2024    DOB: 11-Aug-1981  Insurance: Payor: [employer]ICARE EAST / Plan: [employer] PRIME / Product Type: *No Product type* /      Patient DOB verified Yes     Visit #   Current / Total 10 16   Time   In / Out 8:20 9:00   Pain   In / Out 2/10 2/10   Subjective Functional Status/Changes:   Been resting it. Got new shoes ([place]). They are comfortable.        TREATMENT AREA =  Pain in left knee  Pain in right knee    OBJECTIVE  Therapeutic Procedures:    Tx Min Billable or 1:1 Min (if diff from Tx Min) Procedure, Rationale, Specifics   9  97110 Therapeutic Exercise (timed):  increase ROM, strength, coordination, balance, and proprioception to improve patient's ability to progress to PLOF and address remaining functional goals. (see flow sheet as applicable)     Details if applicable:       8  97112 Neuromuscular Re-Education (timed):  improve balance, coordination, kinesthetic sense, posture, core stability and proprioception to improve patient's ability to develop conscious control of individual muscles and awareness of position of extremities in order to progress to PLOF and address remaining functional goals. (see flow sheet as applicable)     Details if applicable:       97140 Manual Therapy (timed):  decrease pain, increase ROM, increase tissue extensibility, decrease edema, correct positional vertigo, decrease trigger points, and increase postural awareness to improve patient's ability to progress to PLOF and address remaining functional goals.  The manual therapy interventions were performed at a separate and distinct time from the therapeutic activities interventions . (see flow sheet as applicable)     Details if applicable:     23  97530 Therapeutic Activity (timed):  use of dynamic activities replicating functional movements to increase ROM, strength, coordination, balance, and proprioception in order to improve patient's ability  to progress to PLOF and address remaining functional goals.  (see flow sheet as applicable)     Details if applicable:       97535 Self Care/Home Management (timed):  improve patient knowledge and understanding of home injury/symptom/pain management, positioning, posture/ergonomics, home safety, activity modification, transfer techniques, and joint protection strategies  to improve patient's ability to progress to PLOF and address remaining functional goals.  (see flow sheet as applicable)     Details if applicable:    40  MC BC Totals Reminder: bill using total billable min of TIMED therapeutic procedures (example: do not include dry needle or estim unattended, both untimed codes, in totals to left)  8-22 min = 1 unit; 23-37 min = 2 units; 38-52 min = 3 units; 53-67 min = 4 units; 68-82 min = 5 units   Total Total     Charge Capture    [x]   Patient Education billed concurrently with other procedures   [x]  Review HEP    []  Progressed/Changed HEP, detail:   []  Other detail:       Objective Information/Functional Measures/Assessment    Started session with active warm-up, then stability and strength drills to tolerance. Worked squats (15-25), lunge isoms, TRX side lunge. Then SL hip thrusts and pogos. Pt tolerated all drills. Side lunge w TRX and SL hip thrust high challenge. Pt with good response to session. Continue POC.  Patient will continue to benefit from skilled PT services to modify and progress therapeutic interventions, analyze and address functional mobility deficits, analyze and address ROM deficits, analyze and address strength deficits, analyze and address soft tissue restrictions, analyze and cue for proper movement patterns, analyze and modify for postural abnormalities, analyze and address imbalance/dizziness, and instruct in home and community integration to address functional deficits and attain remaining goals.    Progress toward goals / Updated goals:  []   See Progress  Note/Recertification  Next PN/ RC due 09/02/24  Auth due (visit number/ date) 15V, 09/27/24    Short Term Goals: To be accomplished in 4 weeks  Pt to report adherence and demonstrate compliance with basic HEP to allow progress between visits Current: Compliant to most drills (08/16/24)  2.  Pt to report pain <4/10 at worst to allow improved function an/d QOL (8/10 at worst at [facility]) Current: 6/10 at worst after walking up and down the stairs (08/02/24)  3. Pt to demonstrate pain free full knee extension PROM (bilaterally) to indicate reduction in joint capsule irritation Current: MET If I stay in this position it will start to hurt (08/02/24)    Long Term Goals: To be accomplished in 8 weeks  Pt to improve LEFS to 70/80 indicating improved function in daily tasks (61/80 at Eval) NT  2.  Pt to indicate a Global Rating Of Change (GROC) of 4+ indicating moderately better NT  3. Pt to demonstrate 5 minutes run/jog in the clinic and report 15 minutes in the community towards increased PT engagement. NT  4. Pt to demonstrate 35 lbs hamstring strength bilaterally without increase in knee pain to incidate improved knee stability and muscle balance (Eval: R: 20 lbs, 5/10 P!, L: 24 lbs 5/10 P!) Current: R: 21 lbs 6/10 P!, L: 27 lbs 4/10 P! (08/02/24)    PLAN  - Continue Plan of Care    Donnice FORBES North, PT DPT, OCS, Cert-DN   1/77/7974    6:45 AM  If an interpreting service was utilized for treatment of this patient, the contents of this document represent the material reviewed with the patient via the interpreter.     Future Appointments   Date Time Provider Department Center   08/16/2024  8:20 AM North Donnice FORBES [facility]   08/19/2024  7:40 AM North Donnice FORBES, PT [facility]   08/23/2024  8:20 AM Chirstina Haan, Donnice FORBES, PT [facility]

## 2024-08-19 ENCOUNTER — Inpatient Hospital Stay: Payer: TRICARE (CHAMPUS)

## 2024-08-19 NOTE — Telephone Encounter (Signed)
 patient called to advise she is at work and needs to cancel

## 2024-08-19 NOTE — Progress Notes (Incomplete)
 PHYSICAL THERAPY - DAILY TREATMENT NOTE    Patient Name: Nicole Watson    Date: 08/16/2024    DOB: 01-19-1981  Insurance: Payor: [employer] EAST / Plan: [employer]ARE [employer] PRIME / Product Type: *No Product type* /      Patient DOB verified Yes     Visit #   Current / Total 11 16   Time   In / Out 7:40 8:20   Pain   In / Out 2/10 2/10   Subjective Functional Status/Changes:   ***       TREATMENT AREA =  Pain in left knee  Pain in right knee    OBJECTIVE  Therapeutic Procedures:    Tx Min Billable or 1:1 Min (if diff from Tx Min) Procedure, Rationale, Specifics   9  97110 Therapeutic Exercise (timed):  increase ROM, strength, coordination, balance, and proprioception to improve patient's ability to progress to PLOF and address remaining functional goals. (see flow sheet as applicable)     Details if applicable:       8  97112 Neuromuscular Re-Education (timed):  improve balance, coordination, kinesthetic sense, posture, core stability and proprioception to improve patient's ability to develop conscious control of individual muscles and awareness of position of extremities in order to progress to PLOF and address remaining functional goals. (see flow sheet as applicable)     Details if applicable:       97140 Manual Therapy (timed):  decrease pain, increase ROM, increase tissue extensibility, decrease edema, correct positional vertigo, decrease trigger points, and increase postural awareness to improve patient's ability to progress to PLOF and address remaining functional goals.  The manual therapy interventions were performed at a separate and distinct time from the therapeutic activities interventions . (see flow sheet as applicable)     Details if applicable:     23  97530 Therapeutic Activity (timed):  use of dynamic activities replicating functional movements to increase ROM, strength, coordination, balance, and proprioception in order to improve patient's ability to progress to PLOF and address remaining functional  goals.  (see flow sheet as applicable)     Details if applicable:       97535 Self Care/Home Management (timed):  improve patient knowledge and understanding of home injury/symptom/pain management, positioning, posture/ergonomics, home safety, activity modification, transfer techniques, and joint protection strategies  to improve patient's ability to progress to PLOF and address remaining functional goals.  (see flow sheet as applicable)     Details if applicable:    40  MC BC Totals Reminder: bill using total billable min of TIMED therapeutic procedures (example: do not include dry needle or estim unattended, both untimed codes, in totals to left)  8-22 min = 1 unit; 23-37 min = 2 units; 38-52 min = 3 units; 53-67 min = 4 units; 68-82 min = 5 units   Total Total     Charge Capture    [x]   Patient Education billed concurrently with other procedures   [x]  Review HEP    []  Progressed/Changed HEP, detail:   []  Other detail:       Objective Information/Functional Measures/Assessment    ***    Patient will continue to benefit from skilled PT services to modify and progress therapeutic interventions, analyze and address functional mobility deficits, analyze and address ROM deficits, analyze and address strength deficits, analyze and address soft tissue restrictions, analyze and cue for proper movement patterns, analyze and modify for postural abnormalities, analyze and address imbalance/dizziness, and instruct in home  and community integration to address functional deficits and attain remaining goals.    Progress toward goals / Updated goals:  []   See Progress Note/Recertification  Next PN/ RC due 09/02/24  Auth due (visit number/ date) 15V, 09/27/24    Short Term Goals: To be accomplished in 4 weeks  Pt to report adherence and demonstrate compliance with basic HEP to allow progress between visits Current: Compliant to most drills (08/19/24)  2.  Pt to report pain <4/10 at worst to allow improved function an/d QOL (8/10 at  worst at [facility]) Current: 6/10 at worst after walking up and down the stairs (08/02/24)  3. Pt to demonstrate pain free full knee extension PROM (bilaterally) to indicate reduction in joint capsule irritation Current: MET If I stay in this position it will start to hurt (08/02/24)    Long Term Goals: To be accomplished in 8 weeks  Pt to improve LEFS to 70/80 indicating improved function in daily tasks (61/80 at Eval) NT  2.  Pt to indicate a Global Rating Of Change (GROC) of 4+ indicating moderately better NT  3. Pt to demonstrate 5 minutes run/jog in the clinic and report 15 minutes in the community towards increased PT engagement. NT  4. Pt to demonstrate 35 lbs hamstring strength bilaterally without increase in knee pain to incidate improved knee stability and muscle balance (Eval: R: 20 lbs, 5/10 P!, L: 24 lbs 5/10 P!) Current: R: 21 lbs 6/10 P!, L: 27 lbs 4/10 P! (08/02/24)    PLAN  - Continue Plan of Care    Nicole Watson, PT DPT, OCS, Cert-DN   1/77/7974    11:39 AM  If an interpreting service was utilized for treatment of this patient, the contents of this document represent the material reviewed with the patient via the interpreter.     Future Appointments   Date Time Provider Department Center   08/19/2024  7:40 AM Watson Nicole FORBES, PT [facility]   08/23/2024  8:20 AM Fahad Cisse, Nicole FORBES, PT [facility]

## 2024-08-23 ENCOUNTER — Inpatient Hospital Stay: Payer: TRICARE (CHAMPUS)

## 2024-08-23 NOTE — Progress Notes (Incomplete)
 PHYSICAL THERAPY - DAILY TREATMENT NOTE    Patient Name: Nicole Watson    Date: 08/22/2024    DOB: 06-Aug-1981  Insurance: Payor: TRICARE EAST / Plan: [employer]ARE [employer]AST PRIME / Product Type: *No Product type* /      Patient DOB verified Yes     Visit #   Current / Total 11 16   Time   In / Out 8:20 9:00   Pain   In / Out 2/10 2/10   Subjective Functional Status/Changes:   ***       TREATMENT AREA =  Pain in left knee  Pain in right knee    OBJECTIVE  Therapeutic Procedures:    Tx Min Billable or 1:1 Min (if diff from Tx Min) Procedure, Rationale, Specifics   9  97110 Therapeutic Exercise (timed):  increase ROM, strength, coordination, balance, and proprioception to improve patient's ability to progress to PLOF and address remaining functional goals. (see flow sheet as applicable)     Details if applicable:       8  97112 Neuromuscular Re-Education (timed):  improve balance, coordination, kinesthetic sense, posture, core stability and proprioception to improve patient's ability to develop conscious control of individual muscles and awareness of position of extremities in order to progress to PLOF and address remaining functional goals. (see flow sheet as applicable)     Details if applicable:       97140 Manual Therapy (timed):  decrease pain, increase ROM, increase tissue extensibility, decrease edema, correct positional vertigo, decrease trigger points, and increase postural awareness to improve patient's ability to progress to PLOF and address remaining functional goals.  The manual therapy interventions were performed at a separate and distinct time from the therapeutic activities interventions . (see flow sheet as applicable)     Details if applicable:     23  97530 Therapeutic Activity (timed):  use of dynamic activities replicating functional movements to increase ROM, strength, coordination, balance, and proprioception in order to improve patient's ability to progress to PLOF and address remaining functional  goals.  (see flow sheet as applicable)     Details if applicable:       97535 Self Care/Home Management (timed):  improve patient knowledge and understanding of home injury/symptom/pain management, positioning, posture/ergonomics, home safety, activity modification, transfer techniques, and joint protection strategies  to improve patient's ability to progress to PLOF and address remaining functional goals.  (see flow sheet as applicable)     Details if applicable:    40  MC BC Totals Reminder: bill using total billable min of TIMED therapeutic procedures (example: do not include dry needle or estim unattended, both untimed codes, in totals to left)  8-22 min = 1 unit; 23-37 min = 2 units; 38-52 min = 3 units; 53-67 min = 4 units; 68-82 min = 5 units   Total Total     Charge Capture    [x]   Patient Education billed concurrently with other procedures   [x]  Review HEP    []  Progressed/Changed HEP, detail:   []  Other detail:       Objective Information/Functional Measures/Assessment    ***    Patient will continue to benefit from skilled PT services to modify and progress therapeutic interventions, analyze and address functional mobility deficits, analyze and address ROM deficits, analyze and address strength deficits, analyze and address soft tissue restrictions, analyze and cue for proper movement patterns, analyze and modify for postural abnormalities, analyze and address imbalance/dizziness, and instruct in home  and community integration to address functional deficits and attain remaining goals.    Progress toward goals / Updated goals:  []   See Progress Note/Recertification  Next PN/ RC due 09/02/24  Auth due (visit number/ date) 15V, 09/27/24    Short Term Goals: To be accomplished in 4 weeks  Pt to report adherence and demonstrate compliance with basic HEP to allow progress between visits Current: Compliant to most drills (08/23/24)  2.  Pt to report pain <4/10 at worst to allow improved function an/d QOL (8/10 at  worst at [facility]) Current: 6/10 at worst after walking up and down the stairs (08/02/24)  3. Pt to demonstrate pain free full knee extension PROM (bilaterally) to indicate reduction in joint capsule irritation Current: MET If I stay in this position it will start to hurt (08/02/24)    Long Term Goals: To be accomplished in 8 weeks  Pt to improve LEFS to 70/80 indicating improved function in daily tasks (61/80 at Eval) NT  2.  Pt to indicate a Global Rating Of Change (GROC) of 4+ indicating moderately better NT  3. Pt to demonstrate 5 minutes run/jog in the clinic and report 15 minutes in the community towards increased PT engagement. NT  4. Pt to demonstrate 35 lbs hamstring strength bilaterally without increase in knee pain to incidate improved knee stability and muscle balance (Eval: R: 20 lbs, 5/10 P!, L: 24 lbs 5/10 P!) Current: R: 21 lbs 6/10 P!, L: 27 lbs 4/10 P! (08/02/24)    PLAN  - Continue Plan of Care    Donnice FORBES North, PT DPT, OCS, Cert-DN   1/71/7974    2:33 PM  If an interpreting service was utilized for treatment of this patient, the contents of this document represent the material reviewed with the patient via the interpreter.     Future Appointments   Date Time Provider Department Center   08/23/2024  8:20 AM Diana Davenport, Donnice FORBES, PT [facility]

## 2024-08-27 ENCOUNTER — Inpatient Hospital Stay: Admit: 2024-08-27 | Payer: TRICARE (CHAMPUS)

## 2024-08-27 DIAGNOSIS — M25562 Pain in left knee: Principal | ICD-10-CM

## 2024-08-27 NOTE — Progress Notes (Signed)
 PHYSICAL THERAPY - DAILY TREATMENT NOTE    Patient Name: Nicole Watson    Date: 08/27/2024    DOB: [birth year]  Insurance: Payor: TRICARE EAST / Plan: [employer]ARE [employer] PRIME / Product Type: *No Product type* /      Patient DOB verified Yes     Visit #   Current / Total 11 16   Time   In / Out 9:40 10:22   Pain   In / Out 3/10 3/10   Subjective Functional Status/Changes:   Says she was really busy over the weekend. Doing a lot of walking over the weekend and the feet were hurting more than the knees. Is wearing arch support at work, but still hurts. Reports symptoms remain about the same since starting PT, but [place]        TREATMENT AREA =  Pain in left knee  Pain in right knee    OBJECTIVE  Therapeutic Procedures:  Tx Min Billable or 1:1 Min (if diff from Tx Min) Procedure, Rationale, Specifics   12  97110 Therapeutic Exercise (timed):  increase ROM, strength, coordination, balance, and proprioception to improve patient's ability to progress to PLOF and address remaining functional goals. (see flow sheet as applicable)     Details if applicable:         97112 Neuromuscular Re-Education (timed):  improve balance, coordination, kinesthetic sense, posture, core stability and proprioception to improve patient's ability to develop conscious control of individual muscles and awareness of position of extremities in order to progress to PLOF and address remaining functional goals. (see flow sheet as applicable)     Details if applicable:       97140 Manual Therapy (timed):  decrease pain, increase ROM, increase tissue extensibility, decrease edema, correct positional vertigo, decrease trigger points, and increase postural awareness to improve patient's ability to progress to PLOF and address remaining functional goals.  The manual therapy interventions were performed at a separate and distinct time from the therapeutic activities interventions . (see flow sheet as applicable)      Details if applicable:     30  97530 Therapeutic Activity (timed):  use of dynamic activities replicating functional movements to increase ROM, strength, coordination, balance, and proprioception in order to improve patient's ability to progress to PLOF and address remaining functional goals.  (see flow sheet as applicable)     Details if applicable:       97535 Self Care/Home Management (timed):  improve patient knowledge and understanding of home injury/symptom/pain management, positioning, posture/ergonomics, home safety, activity modification, transfer techniques, and joint protection strategies  to improve patient's ability to progress to PLOF and address remaining functional goals.  (see flow sheet as applicable)     Details if applicable:    42  MC BC Totals Reminder: bill using total billable min of TIMED therapeutic procedures (example: do not include dry needle or estim unattended, both untimed codes, in totals to left)  8-22 min = 1 unit; 23-37 min = 2 units; 38-52 min = 3 units; 53-67 min = 4 units; 68-82 min = 5 units   Total Total     Charge Capture    [x]   Patient Education billed concurrently with other procedures   [x]  Review HEP    []  Progressed/Changed HEP, detail:   []  Other detail:       Objective Information/Functional Measures/Assessment  Active Warm up: knee hugs, quad pulls, heel  swipes, toe walks, heel walks, Ant Tib raises at wall 3x10 with 10sec hold on last rep    Therex per flow sheet. Good effort with session. Pt vocal about how fatigued her hips feel with hip thrusts and cross over step ups. Cross over step ups feel better on the knees today than lateral step ups. Encouraged breaks throughout session. Pt complained of L knee pain with TRX lat lunges - reduced pain with less ROM. Ended with quad stretch. Advised of DOMS.    Patient will continue to benefit from skilled PT services to modify and progress therapeutic interventions, analyze and address functional mobility deficits,  analyze and address ROM deficits, analyze and address strength deficits, analyze and address soft tissue restrictions, analyze and cue for proper movement patterns, analyze and modify for postural abnormalities, analyze and address imbalance/dizziness, and instruct in home and community integration to address functional deficits and attain remaining goals.    Progress toward goals / Updated goals:  []   See Progress Note/Recertification  Next PN/ RC due 09/02/24  Auth due (visit number/ date) 15V, 09/27/24    Short Term Goals: To be accomplished in 4 weeks  Pt to report adherence and demonstrate compliance with basic HEP to allow progress between visits Current: Compliant to most drills (08/16/24)  2.  Pt to report pain <4/10 at worst to allow improved function an/d QOL (8/10 at worst at [facility]) Current: 6/10 at worst after walking up and down the stairs (08/02/24)  3. Pt to demonstrate pain free full knee extension PROM (bilaterally) to indicate reduction in joint capsule irritation Current: MET If I stay in this position it will start to hurt (08/02/24)    Long Term Goals: To be accomplished in 8 weeks  Pt to improve LEFS to 70/80 indicating improved function in daily tasks (61/80 at Eval) NT  2.  Pt to indicate a Global Rating Of Change (GROC) of 4+ indicating moderately better NT  3. Pt to demonstrate 5 minutes run/jog in the clinic and report 15 minutes in the community towards increased PT engagement. NT  4. Pt to demonstrate 35 lbs hamstring strength bilaterally without increase in knee pain to incidate improved knee stability and muscle balance (Eval: R: 20 lbs, 5/10 P!, L: 24 lbs 5/10 P!) Current: R: 21 lbs 6/10 P!, L: 27 lbs 4/10 P! (08/02/24)    PLAN  - Continue Plan of Care    [place], PT DPT, Cert-DN   0/06/7973    7:36 AM  If an interpreting service was utilized for treatment of this patient, the contents of this document represent the material reviewed with the patient via the interpreter.     Future  Appointments   Date Time Provider Department Center   08/27/2024  9:40 AM Cornelious Specter, PT [facility]   09/02/2024  9:40 AM Buck Donnice BRAVO, PT [facility]   09/06/2024  7:00 AM Dancigers, Donnice BRAVO, PT [facility]

## 2024-09-02 ENCOUNTER — Inpatient Hospital Stay: Admit: 2024-09-02 | Payer: TRICARE (CHAMPUS)

## 2024-09-02 NOTE — Progress Notes (Signed)
 [facility]  [facility]  [address], [address], [place]  [place], [place] [address]   Ph: [phone] Fx: [phone]    DISCHARGE INSTRUCTIONS    Patient: Nicole Watson  DOB: [birth year]  Date: 09/02/2024    Reason for discharge from physical therapy:    [x]  Met or Progressing to all set goals    []  Minimal Progress made to set goals    []  Plateau in improvement    []  Insurance/financial factors    []  Other: ______________________    Instructions for home    Continue Home Exercise Program 4-5 times per week as indicated on your handout      Continue with    [x]  Ice    [x]  Heat   as needed                                                             Follow up with MD:      []  Upon completion of therapy                           [x]  As needed     If you have severe/emergency pain call 9-1-1 or seek immediate medical attention     Additional Comments: -      Information Regarding Direct Access to physical therapy    law now allows for a patient to see a physical therapist directly (without having first seen a physician) for an evaluation and treatment. Certain rules apply and not all insurances participate. If you are interested in seeing a physical therapist directly for your injury/condition please contact us  or visit [email or web address]      We value your opinion! Please leave us  a Google review by searching [facility] then select reviews.      Donnice BRAVO Barry Culverhouse, PT 09/02/2024 10:18 AM

## 2024-09-02 NOTE — Discharge Instructions (Signed)
 [place] [facility]  [address], [address], [place], [place] [address] - Ph: [phone]  Fx: [phone]  DISCHARGE SUMMARY  Patient Name: Nicole Watson DOB: August 07, 1981   Medical/Treatment Diagnosis: Pain in left knee  Pain in right knee   Referral Source: Lansangan, Fredilynn P,*     Date of Initial Visit: 06/26/24 Attended Visits: 12 Missed Visits:  9     Subjective:  Pt reports 90% of where patient wants to be. Pt reports improvements in understanding of hip and knee strength. She went o the treadmill on an incline and it felt god.  Pt reports continued difficulties in fear of not being consistent, and fear of re-injury.   Reports she has another referral for her shoulder. Says she is not going to do the PFA. She attributes this to doing push-ups.     CURRENT STATUS  Pt seen for 12 visits to address bilateral knee pain. Pt with clear progress in function, return towards running (pt has jogged and has completed POGOs and some early phase jumping drills). Pt subjective reports are improved from Eval. She has MET all STGs, and near-Met all LTGs. Pt participating well in sessions. Due to pt progress and current status she is appropriate to DC from this episode of care at this time. Both parties agreed to DC plan, all questions answered.     GOALS:  Short Term Goals: To be accomplished in 4 weeks  Pt to report adherence and demonstrate compliance with basic HEP to allow progress between visits Current: Compliant to most drills (09/02/24)  2.  Pt to report pain <4/10 at worst to allow improved function an/d QOL (8/10 at worst at [facility]) Current: MET 1-2/10 P! (09/02/24) (was: 6/10 at worst after walking up and down the stairs (08/02/24)  3. Pt to demonstrate pain free full knee extension PROM (bilaterally) to indicate reduction in joint capsule irritation Current: MET If I stay in this position it will start to hurt (08/02/24)    Long Term Goals: To be accomplished in  8 weeks  Pt to improve LEFS to 70/80 indicating improved function in daily tasks (61/80 at Eval) Current: MET 72/80 (09/02/24)  2.  Pt to indicate a Global Rating Of Change (GROC) of 4+ indicating moderately better Current MET: 6+ a great deal better (09/02/24)  3. Pt to demonstrate 5 minutes run/jog in the clinic and report 15 minutes in the community towards increased PT engagement. Current: MET in clinic and reported at home on TM (09/02/24)  4. Pt to demonstrate 35 lbs hamstring strength bilaterally without increase in knee pain to incidate improved knee stability and muscle balance (Eval: R: 20 lbs, 5/10 P!, L: 24 lbs 5/10 P!)   Current Progressing: R: 26 lbs 1/10 P!, L: 34 lbs 1/10 P!   (Was: R: 21 lbs 6/10 P!, L: 27 lbs 4/10 P! (08/02/24)      non-Medicare    RECOMMENDATIONS  Discontinue therapy. Progressing towards or have reached established goals.    If you have any questions/comments please contact us  directly at [phone].   Thank you for allowing us  to assist in the care of your patient.    Donnice FORBES North, PT DPT, OCS, Cert-DN      09/02/2024       9:48 AM    Discharge signatures no longer required as of 07/11/24

## 2024-09-02 NOTE — Progress Notes (Signed)
 PHYSICAL THERAPY - DAILY TREATMENT NOTE    Patient Name: Nicole Watson    Date: 09/02/2024    DOB: 03/27/1981  Insurance: Payor: TRICARE EAST / Plan: TRICARE EAST PRIME / Product Type: *No Product type* /      Patient DOB verified Yes     Visit #   Current / Total 12 16   Time   In / Out 9:40 10:20   Pain   In / Out 0-1/10 0-1/10   Subjective Functional Status/Changes:   Pt reports 90% of where patient wants to be. Pt reports improvements in understanding of hip and knee strength. She went o the treadmill on an incline and it felt god.  Pt reports continued difficulties in fear of not being consistent, and fear of re-injury.   Reports she has another referral for her shoulder. Says she is not going to do the PFA. She attributes this to doing push-ups.        TREATMENT AREA =  Pain in left knee  Pain in right knee    OBJECTIVE  Therapeutic Procedures:  Tx Min Billable or 1:1 Min (if diff from Tx Min) Procedure, Rationale, Specifics     97110 Therapeutic Exercise (timed):  increase ROM, strength, coordination, balance, and proprioception to improve patient's ability to progress to PLOF and address remaining functional goals. (see flow sheet as applicable)     Details if applicable:         97112 Neuromuscular Re-Education (timed):  improve balance, coordination, kinesthetic sense, posture, core stability and proprioception to improve patient's ability to develop conscious control of individual muscles and awareness of position of extremities in order to progress to PLOF and address remaining functional goals. (see flow sheet as applicable)     Details if applicable:     8  97116 Gait Training (timed):    TM feet with TM (assistive device) over TM surfaces with supervision level of assist. Cuing for endurance.  To improve safety and dynamic movement with household/community ambulation.  (see flow sheet as applicable)     Details if applicable:  5 minutes TM jog 4.3-5.5 mph (includes set-up and warm-up cool down   9   97530 Therapeutic Activity (timed):  use of dynamic activities replicating functional movements to increase ROM, strength, coordination, balance, and proprioception in order to improve patient's ability to progress to PLOF and address remaining functional goals.  (see flow sheet as applicable)     Details if applicable:     23  97535 Self Care/Home Management (timed):  improve patient knowledge and understanding of home injury/symptom/pain management, positioning, posture/ergonomics, home safety, activity modification, transfer techniques, and joint protection strategies  to improve patient's ability to progress to PLOF and address remaining functional goals.  (see flow sheet as applicable)     Details if applicable:    40  MC BC Totals Reminder: bill using total billable min of TIMED therapeutic procedures (example: do not include dry needle or estim unattended, both untimed codes, in totals to left)  8-22 min = 1 unit; 23-37 min = 2 units; 38-52 min = 3 units; 53-67 min = 4 units; 68-82 min = 5 units   Total Total     [x]   Patient Education billed concurrently with other procedures   [x]  Review HEP    []  Progressed/Changed HEP, detail:   []  Other detail:       Objective Information/Functional Measures/Assessment    Pt seen for 12 visits to address bilateral  knee pain. Pt with clear progress in function, return towards running (pt has jogged and has completed POGOs and some early phase jumping drills). Pt subjective reports are improved from Eval. She has MET all STGs, and near-Met all LTGs. Pt participating well in sessions. Due to pt progress and current status she is appropriate to DC from this episode of care at this time. Both parties agreed to DC plan, all questions answered.     Patient will continue to benefit from skilled PT services to modify and progress therapeutic interventions, analyze and address functional mobility deficits, analyze and address ROM deficits, analyze and address strength deficits,  analyze and address soft tissue restrictions, analyze and cue for proper movement patterns, analyze and modify for postural abnormalities, analyze and address imbalance/dizziness, and instruct in home and community integration to address functional deficits and attain remaining goals.    Progress toward goals / Updated goals:  []   See Progress Note/Recertification  Next PN/ RC due DC  Auth due (visit number/ date) 15V, 09/27/24    Short Term Goals: To be accomplished in 4 weeks  Pt to report adherence and demonstrate compliance with basic HEP to allow progress between visits Current: Compliant to most drills (09/02/24)  2.  Pt to report pain <4/10 at worst to allow improved function an/d QOL (8/10 at worst at [facility]) Current: MET 1-2/10 P! (09/02/24) (was: 6/10 at worst after walking up and down the stairs (08/02/24)  3. Pt to demonstrate pain free full knee extension PROM (bilaterally) to indicate reduction in joint capsule irritation Current: MET If I stay in this position it will start to hurt (08/02/24)    Long Term Goals: To be accomplished in 8 weeks  Pt to improve LEFS to 70/80 indicating improved function in daily tasks (61/80 at Eval) Current: MET 72/80 (09/02/24)  2.  Pt to indicate a Global Rating Of Change (GROC) of 4+ indicating moderately better Current MET: 6+ a great deal better (09/02/24)  3. Pt to demonstrate 5 minutes run/jog in the clinic and report 15 minutes in the community towards increased PT engagement. Current: MET in clinic and reported at home on TM (09/02/24)  4. Pt to demonstrate 35 lbs hamstring strength bilaterally without increase in knee pain to incidate improved knee stability and muscle balance (Eval: R: 20 lbs, 5/10 P!, L: 24 lbs 5/10 P!)   Current Progressing: R: 26 lbs 1/10 P!, L: 34 lbs 1/10 P!   (Was: R: 21 lbs 6/10 P!, L: 27 lbs 4/10 P! (08/02/24)    PLAN  DC case    Donnice FORBES North, PT DPT, OCS, Cert-DN   0/12/7972    6:50 AM  If an interpreting service was utilized for treatment of  this patient, the contents of this document represent the material reviewed with the patient via the interpreter.     Future Appointments   Date Time Provider Department Center   09/02/2024  9:40 AM North Donnice FORBES, PT [facility]   09/06/2024  7:00 AM Garrette Caine, Donnice FORBES, PT [facility]

## 2024-09-06 ENCOUNTER — Inpatient Hospital Stay: Admit: 2024-09-06 | Payer: TRICARE (CHAMPUS)

## 2024-09-06 NOTE — Progress Notes (Signed)
 PHYSICAL THERAPY - DAILY TREATMENT NOTE    Patient Name: Nicole Watson    Date: 09/06/2024    DOB: 04/04/1981  Insurance: Payor: TRICARE EAST / Plan: TRICARE EAST PRIME / Product Type: *No Product type* /      Patient DOB verified YES   Visit #   Current / Total 1 16   Time   In / Out 7:00 7:40   Pain   In / Out 1 1   Subjective Functional Status/Changes: SEE EVALUATION     TREATMENT AREA = Pain in left shoulder    OBJECTIVE        9 min   Eval - untimed                      Therapeutic Procedures:  Tx Min Billable or 1:1 Min (if diff from Tx Min) Procedure, Rationale, Specifics   8  97535 Self Care/Home Management (timed):  improve patient knowledge and understanding of home injury/symptom/pain management, positioning, and activity modification  to improve patient's ability to progress to PLOF and address remaining functional goals.  (see flow sheet as applicable)     Details if applicable:       23  97530 Therapeutic Activity (timed):  use of dynamic activities replicating functional movements to increase ROM, strength, coordination, balance, and proprioception in order to improve patient's ability to progress to PLOF and address remaining functional goals.  (see flow sheet as applicable)     Details if applicable:            Details if applicable:     31 40 MC BC Totals Reminder: bill using total billable min of TIMED therapeutic procedures (example: do not include dry needle or estim unattended, both untimed codes, in totals to left)  8-22 min = 1 unit; 23-37 min = 2 units; 38-52 min = 3 units; 53-67 min = 4 units; 68-82 min = 5 units   Total Total     [x]   Patient Education billed concurrently with other procedures   [x]  Review HEP    []  Progressed/Changed HEP, detail:    []  Other detail:       Objective Information/Functional Measures/Assessment    SEE EVALUATION    Patient will benefit from skilled PT services to  modify and progress therapeutic interventions, analyze and address functional mobility  deficits, analyze and address ROM deficits, analyze and address strength deficits, analyze and address soft tissue restrictions, analyze and cue for proper movement patterns, and analyze and modify for postural abnormalities to address functional deficits and attain remaining goals.      Progress toward goals / Updated goals:  []   See Progress Note/Recertification    SEE EVAL    Next PN / RC due: 10/06/24  Auth due: 15v thru 10/11/24    PLAN  YES Continue plan of care  [x]   Upgrade activities as tolerated  []   Discharge due to :  []   Other:    Nicole Watson, PT DPT, OCS, Cert-DN   0/87/7974    6:51 AM    *If an interpreting service was utilized for treatment of this patient, the contents of this document represent the material reviewed with the patient via the interpreter.     Future Appointments   Date Time Provider Department Center   09/06/2024  7:00 AM Nicole Watson, Nicole Watson, PT [facility]

## 2024-09-06 NOTE — Other (Signed)
 [place] [facility] - [facility]  [address], [place], [place] [address] [phone] Fx: [phone]  Plan of Care / Statement of Necessity for Physical Therapy Services     Patient Name: Nicole Watson DOB: 07-Aug-1981   Medical   Diagnosis: Pain in left shoulder Treatment Diagnosis: M25.512  LEFT SHOULDER PAIN    Onset Date: May 2025     Referral Source: Ladoris Zachary Renny DEWAINE Start of Care [facility]): 09/06/2024   Prior Hospitalization: See medical history Provider #: [phone]   Prior Level of Function: Was indep and pain free   Comorbidities:  HTN, depression, lung condition   Subjective: Pt relates her injury to her [employer]. Pt recalls doing push-ups and the left shoulder locked up and I just stopped. Says the pain was there and she got excused for doing push-ups. Reports she hasn't done push-ups since then. Reports the left shoulder hurts to lay on it / sleep on it. Says a normal 2/10 during the day. Reports she avoid lifting heavy things, she can cook and do things. She thinks rest is the best for her. She is scared the shoulder will hurt so she avoids using the arm. Reports she uses a rolling suitcase for work instead of carrying a bag.   Reports wants to get back to trying to do push-ups. (Her standard is 44 push-ups in 2 minutes).    Assessment / key information:  Pt presents to [facility] with signs and symptoms congruent with a diagnosis of left shoulder pain. Primarily subjective complaints. Based on testing pain is endurance based, likely poor programming, from doing too little followed by doing too much, exceeding capacity during tasks (push-ups). Pain seemingly bursitis-type in nature at this time.   Patient would benefit from skilled intervention to address deficits, improve quality of life and return patient to maximum level of available prior function.  Not post operative, standard auth  procedure    OBJECTIVE shoulder:   Posture/Positioning: WNL, slight FHRS, variable.  Cervical Screen: WNL, pain free    Standing Shoulder AROM:  Flexion: L: 150 deg,  R: 150 deg  ABD: L: 170 deg,  R: 170 deg  Functional ER: L: T4,  R: T4  Functional IR: L: T6,  R: T6    Shoulder Strength: (lbs)  Flexion: L: 16 R: 14   ABD:  L: 25 R: 24  ER: L: 16 R: 16  IR:  L: 18 R: 19  Horizontal ABDuction: L: 10, R: 12  Bench Press set-up (supine, arm at 45 deg and pressing to ceiling, tested at bottom of bench position: L: 31, R: 28  Power Grip Level2:  L: 35 lbs, R: 35 lbs    Push-Up (25 elevated): x10, I can just feel it. Pt pointing to superior posterior (GH joint) and superior anterior humerus/subacromion area    Palpation: TTP uperior posterior (GH joint) and superior anterior humerus/subacromion area  HEP: Access Code: [email or web address]  URL: [email or web address]  Date: 09/06/2024  Prepared by: Donnice North    Exercises  - Standing shoulder flexion wall slides  - 1 x daily - 7 x weekly - 1 sets - 10 reps  - Supine Chest Flys  - 1 x daily - 7 x weekly - 4 sets - 10 reps  - Push Up on Table  - 1 x daily - 7 x weekly - 1 sets - 10 reps  - Primal Push Up  -  1 x daily - 7 x weekly - 1 sets - 5 reps - 10 hold  - Full Plank with Shoulder Taps  - 1 x daily - 7 x weekly - 1 sets - 10 reps  - Doorway Pec Stretch at 60 Elevation  - 1 x daily - 7 x weekly - 1 sets - 4 reps - 15 hold      Evaluation Complexity:  History:  HIGH Complexity :3+ comorbidities / personal factors will impact the outcome/ POC ; Examination:  MEDIUM Complexity : 3 Standardized tests and measures addressin body structure, function, activity limitation and / or participation in recreation  ;Presentation:  MEDIUM Complexity : Evolving with changing characteristics  ;Clinical Decision Making: QuickDASH: Disability Arm, Shoulder, Hand = 39 % ; (0% - 40% Normal to Mild Disability) = LOW Complexity  Overall Complexity Rating: MEDIUM  Problem  List: pain affecting function and decrease activity tolerance   Treatment Plan may include any combination of the following:  97110 Therapeutic Exercise, 97112 Neuromuscular Re-Education, 97140 Manual Therapy, 97530 Therapeutic Activity, 97535 Self Care/Home Management, 97014 Electrical Stim unattended / [phone] [facility]), [id number] Electrical Stim attended, and (Elective Self Pay) Needle Insertion w/o Injection (1 or 2 muscles), (3+ muscles)  Patient / Family readiness to learn indicated by: asking questions, trying to perform skills, interest, return verbalization , and return demonstration   Persons(s) to be included in education: patient (P)  Barriers to Learning/Limitations: none  Measures taken if barriers to learning present: -  Patient Goal (s): do push-ups  Patient Self Reported Health Status: good  Rehabilitation Potential: good    Short Term Goals: To be accomplished in 4 weeks  Pt to report adherence and demonstrate compliance with basic HEP to allow progress between visits  2.  Pt to report pain <3/10 at worst to allow improved function and QOL (8/10 at worst at [facility])  3. Pt to report non-disturbed sleep of 7hrs to allow improved healing and QOL     Long Term Goals: To be accomplished in 8 weeks  Pt to improve Quick DASH score to 25% indicating improved function in daily tasks (39% at Eval)  2.  Pt to indicate a Global Rating Of Change (GROC) of 4+ indicating moderately better  3. Pt to demonstrate 4x 10 push-ups (floor) with good form and pain under 2/10 to indicate improved work capacity at the shoulder towards PLOF      Frequency / Duration: Patient to be seen 2 times per week for 8 weeks  Patient/ Caregiver education and instruction: Diagnosis, prognosis,  self care, activity modification, and exercises  [x]   Plan of care has been reviewed with PTA, prn.    Certification Period: non-Medicare or non-Medicaid: N/A    Donnice FORBES North, PT DPT, OCS, Cert-DN      09/06/2024       6:51 AM    Payor: TRICARE  EAST / Plan: TRICARE EAST PRIME / Product Type: *No Product type* /     No Physician signature required; Signature on POC no longer required for Medicare as of 12/27/23

## 2024-09-09 ENCOUNTER — Inpatient Hospital Stay: Payer: TRICARE (CHAMPUS)

## 2024-09-16 ENCOUNTER — Inpatient Hospital Stay: Admit: 2024-09-16 | Payer: TRICARE (CHAMPUS)

## 2024-09-16 NOTE — Progress Notes (Signed)
 PHYSICAL / OCCUPATIONAL THERAPY - DAILY TREATMENT NOTE    Patient Name: Nicole Watson    Date: 09/16/2024    DOB: 1981-04-17  Insurance: Payor: [employer] / Plan: [employer] PRIME / Product Type: *No Product type* /      Patient DOB verified Yes     Visit #   Current / Total 2 16   Time   In / Out 740 820   Pain   In / Out 1 1   Subjective Functional Status/Changes: Patient reports she wakes up a 2/10 but it reduces to 1/10 once she starts her day.     TREATMENT AREA =  Pain in left shoulder    OBJECTIVE         Therapeutic Procedures:    Tx Min Billable or 1:1 Min (if diff from Tx Min) Procedure, Rationale, Specifics   32  97110 Therapeutic Exercise (timed):  increase ROM, strength, coordination, balance, and proprioception to improve patient's ability to progress to PLOF and address remaining functional goals. (see flow sheet as applicable)     Details if applicable:         97112 Neuromuscular Re-Education (timed):  improve balance, coordination, kinesthetic sense, posture, core stability and proprioception to improve patient's ability to develop conscious control of individual muscles and awareness of position of extremities in order to progress to PLOF and address remaining functional goals. (see flow sheet as applicable)     Details if applicable:     8  97140 Manual Therapy (timed):  Use of assisted stretching, joint mobilization, PROM, and soft tissue mobilization to decrease pain, increase ROM, increase tissue extensibility, decrease edema, correct positional vertigo, decrease trigger points, and increase postural awareness to improve patient's ability to progress to PLOF and address remaining functional goals.  The manual therapy interventions were performed at a separate and distinct time from the therapeutic activities interventions . (see flow sheet as applicable)                40  MC BC Totals Reminder: bill using total billable min of TIMED therapeutic procedures (example: do not include dry  needle or estim unattended, both untimed codes, in totals to left)  8-22 min = 1 unit; 23-37 min = 2 units; 38-52 min = 3 units; 53-67 min = 4 units; 68-82 min = 5 units   Total Total     Charge Capture    [x]   Patient Education billed concurrently with other procedures   [x]  Review HEP    []  Progressed/Changed HEP, detail:    []  Other detail:       Objective Information/Functional Measures/Assessment    Chart reviewed and subjective taken. Pt requires cues and instruction for all drills, form, therapeutic focus, and carryover. Followed up on HEP plan to ensure compliance.    Patient will continue to benefit from skilled PT / OT services to modify and progress therapeutic interventions, analyze and address functional mobility deficits, analyze and address ROM deficits, analyze and address strength deficits, analyze and address soft tissue restrictions, analyze and cue for proper movement patterns, analyze and modify for postural abnormalities, analyze and address imbalance/dizziness, and instruct in home and community integration to address functional deficits and attain remaining goals.    Progress toward goals / Updated goals:  []   See Progress Note/Recertification    First Follow Up since IE    Next PN 10/06/24  RC or Medicaid tracking due n/a  Auth due (visit number/ date)  15V 10/11/24    PLAN  - Continue Plan of Care    Kyrstan Gotwalt, PTA    09/16/2024    7:44 AM  If an interpreting service was utilized for treatment of this patient, the contents of this document represent the material reviewed with the patient via the interpreter.     Future Appointments   Date Time Provider Department Center   09/18/2024  7:40 AM Melvenia [place], [facility]   09/23/2024  7:40 AM Melvenia [place], [facility]   09/30/2024  9:00 AM Buck Donnice BRAVO, PT [facility]   10/02/2024  7:40 AM Buck Donnice BRAVO, PT [facility]   10/07/2024  7:40 AM Buck Donnice BRAVO, PT [facility]   10/11/2024  7:40 AM Buck Donnice BRAVO, PT [facility]    10/16/2024  7:40 AM Buck Donnice BRAVO, PT [facility]   10/18/2024  7:40 AM Buck Donnice BRAVO, PT [facility]   10/21/2024  7:40 AM Buck Donnice BRAVO, PT [facility]   10/23/2024  7:40 AM Dancigers, Donnice BRAVO, PT [facility]

## 2024-09-18 ENCOUNTER — Inpatient Hospital Stay: Admit: 2024-09-18 | Payer: TRICARE (CHAMPUS)

## 2024-09-18 NOTE — Progress Notes (Signed)
 PHYSICAL / OCCUPATIONAL THERAPY - DAILY TREATMENT NOTE    Patient Name: Nicole Watson    Date: 09/18/2024    DOB: 05-28-1981  Insurance: Payor: [employer] / Plan: [employer] PRIME / Product Type: *No Product type* /      Patient DOB verified Yes     Visit #   Current / Total 3 16   Time   In / Out 740 820   Pain   In / Out 1 1   Subjective Functional Status/Changes: Pt notes being sore from last visit.       TREATMENT AREA =  Pain in left shoulder    OBJECTIVE         Therapeutic Procedures:    Tx Min Billable or 1:1 Min (if diff from Tx Min) Procedure, Rationale, Specifics   32  97110 Therapeutic Exercise (timed):  increase ROM, strength, coordination, balance, and proprioception to improve patient's ability to progress to PLOF and address remaining functional goals. (see flow sheet as applicable)     Details if applicable:       8  97112 Neuromuscular Re-Education (timed):  improve balance, coordination, kinesthetic sense, posture, core stability and proprioception to improve patient's ability to develop conscious control of individual muscles and awareness of position of extremities in order to progress to PLOF and address remaining functional goals. (see flow sheet as applicable)     Details if applicable:       97140 Manual Therapy (timed):  Use of assisted stretching, joint mobilization, PROM, and soft tissue mobilization to decrease pain, increase ROM, increase tissue extensibility, decrease edema, correct positional vertigo, decrease trigger points, and increase postural awareness to improve patient's ability to progress to PLOF and address remaining functional goals.  The manual therapy interventions were performed at a separate and distinct time from the therapeutic activities interventions . (see flow sheet as applicable)                40  MC BC Totals Reminder: bill using total billable min of TIMED therapeutic procedures (example: do not include dry needle or estim unattended, both untimed codes,  in totals to left)  8-22 min = 1 unit; 23-37 min = 2 units; 38-52 min = 3 units; 53-67 min = 4 units; 68-82 min = 5 units   Total Total     Charge Capture    [x]   Patient Education billed concurrently with other procedures   [x]  Review HEP    []  Progressed/Changed HEP, detail:    []  Other detail:       Objective Information/Functional Measures/Assessment    Patient with good tolerance to most drills, did struggle with landmine press and required an assist along with push ups from elevated surface.  Introduced 3pt row with no issues reported.    Patient will continue to benefit from skilled PT / OT services to modify and progress therapeutic interventions, analyze and address functional mobility deficits, analyze and address ROM deficits, analyze and address strength deficits, analyze and address soft tissue restrictions, analyze and cue for proper movement patterns, analyze and modify for postural abnormalities, analyze and address imbalance/dizziness, and instruct in home and community integration to address functional deficits and attain remaining goals.    Progress toward goals / Updated goals:  []   See Progress Note/Recertification    Patient reporting compliance with HEP.    Next PN 10/06/24  RC or Medicaid tracking due n/a  Auth due (visit number/ date) 15V 10/11/24  PLAN  - Continue Plan of Care    Arabela Basaldua, PTA    09/18/2024    5:57 PM  If an interpreting service was utilized for treatment of this patient, the contents of this document represent the material reviewed with the patient via the interpreter.     Future Appointments   Date Time Provider Department Center   09/23/2024  7:40 AM Melvenia Plowman, [facility]   09/30/2024  9:00 AM Buck Donnice BRAVO, PT [facility]   10/02/2024  7:40 AM Buck Donnice BRAVO, PT [facility]   10/07/2024  7:40 AM Buck Donnice BRAVO, PT [facility]   10/11/2024  7:40 AM Buck Donnice BRAVO, PT [facility]   10/16/2024  7:40 AM Buck Donnice BRAVO, PT [facility]    10/18/2024  7:40 AM Buck Donnice BRAVO, PT [facility]   10/21/2024  7:40 AM Buck Donnice BRAVO, PT [facility]   10/23/2024  7:40 AM Dancigers, Donnice BRAVO, PT [facility]

## 2024-09-23 ENCOUNTER — Inpatient Hospital Stay: Admit: 2024-09-23 | Payer: TRICARE (CHAMPUS)

## 2024-09-23 NOTE — Progress Notes (Signed)
 PHYSICAL / OCCUPATIONAL THERAPY - DAILY TREATMENT NOTE    Patient Name: Nicole Watson    Date: 09/23/2024    DOB: [birth year]  Insurance: Payor: TRICARE EAST / Plan: [employer]ARE [employer] PRIME / Product Type: *No Product type* /      Patient DOB verified Yes     Visit #   Current / Total 3 16   Time   In / Out 740 820   Pain   In / Out 2 1   Subjective Functional Status/Changes: Pt notes she slept on her L side causing more pain this morning.  Says that she has not noticed improvement in her pain.     TREATMENT AREA =  Pain in left shoulder    OBJECTIVE         Therapeutic Procedures:    Tx Min Billable or 1:1 Min (if diff from Tx Min) Procedure, Rationale, Specifics   24  97110 Therapeutic Exercise (timed):  increase ROM, strength, coordination, balance, and proprioception to improve patient's ability to progress to PLOF and address remaining functional goals. (see flow sheet as applicable)     Details if applicable:       8  97112 Neuromuscular Re-Education (timed):  improve balance, coordination, kinesthetic sense, posture, core stability and proprioception to improve patient's ability to develop conscious control of individual muscles and awareness of position of extremities in order to progress to PLOF and address remaining functional goals. (see flow sheet as applicable)     Details if applicable:     8  97140 Manual Therapy (timed):  Use of assisted stretching, joint mobilization, PROM, and soft tissue mobilization to decrease pain, increase ROM, increase tissue extensibility, decrease edema, correct positional vertigo, decrease trigger points, and increase postural awareness to improve patient's ability to progress to PLOF and address remaining functional goals.  The manual therapy interventions were performed at a separate and distinct time from the therapeutic activities interventions . (see flow sheet as applicable)     Details if applicable: STM: delts, UT, rhomboids (TTP in rhomboids)             40  MC BC  Totals Reminder: bill using total billable min of TIMED therapeutic procedures (example: do not include dry needle or estim unattended, both untimed codes, in totals to left)  8-22 min = 1 unit; 23-37 min = 2 units; 38-52 min = 3 units; 53-67 min = 4 units; 68-82 min = 5 units   Total Total     Charge Capture    [x]   Patient Education billed concurrently with other procedures   [x]  Review HEP    []  Progressed/Changed HEP, detail:    []  Other detail:       Objective Information/Functional Measures/Assessment  Patient continues to remain reactionary to landmine presses and push ups.  She does admit pain diminishes with drills minus the landmine and push ups.  ADDED: resisted ABD in standing (full 180 deg resistance is anterior) and serratus punch.    Continuing to progress as tolerated and per POC.    Patient will continue to benefit from skilled PT / OT services to modify and progress therapeutic interventions, analyze and address functional mobility deficits, analyze and address ROM deficits, analyze and address strength deficits, analyze and address soft tissue restrictions, analyze and cue for proper movement patterns, analyze and modify for postural abnormalities, analyze and address imbalance/dizziness, and instruct in home and community integration to address functional deficits and attain remaining goals.  Progress toward goals / Updated goals:  []   See Progress Note/Recertification    No status update - 09/23/24  Next PN 10/06/24  RC or Medicaid tracking due n/a  Auth due (visit number/ date) 15V 10/11/24    PLAN  - Continue Plan of Care    Mazi Schuff, PTA    09/23/2024    8:00 AM  If an interpreting service was utilized for treatment of this patient, the contents of this document represent the material reviewed with the patient via the interpreter.     Future Appointments   Date Time Provider Department Center   09/30/2024  9:00 AM Buck Donnice BRAVO, PT [facility]   10/02/2024  7:40 AM Buck Donnice BRAVO, PT [facility]   10/07/2024  7:40 AM Buck Donnice BRAVO, PT [facility]   10/11/2024  7:40 AM Buck Donnice BRAVO, PT [facility]   10/16/2024  7:40 AM Buck Donnice BRAVO, PT [facility]   10/18/2024  7:40 AM Buck Donnice BRAVO, PT [facility]   10/21/2024  7:40 AM Buck Donnice BRAVO, PT [facility]   10/23/2024  7:40 AM Dancigers, Donnice BRAVO, PT [facility]

## 2024-09-30 ENCOUNTER — Inpatient Hospital Stay: Payer: TRICARE (CHAMPUS)

## 2024-10-02 ENCOUNTER — Inpatient Hospital Stay: Payer: TRICARE (CHAMPUS)

## 2024-10-02 NOTE — Telephone Encounter (Signed)
"  NS today. Pt reports she forgot to call, she had a schedule change at work. I informed of next apt date/time and pt confirms this works for her.   "

## 2024-10-07 ENCOUNTER — Inpatient Hospital Stay: Payer: TRICARE (CHAMPUS)

## 2024-10-07 NOTE — Telephone Encounter (Signed)
"  Called patient due to them no showing today's appt. Patient did not answer, left message checking on patient and informing them of our attendance policy and placing patient on same day scheduling.   "

## 2024-10-11 ENCOUNTER — Inpatient Hospital Stay: Payer: TRICARE (CHAMPUS)
